# Patient Record
Sex: Female | Born: 1995 | Race: White | Hispanic: No | Marital: Single | Smoking: Current every day smoker
Health system: Southern US, Community
[De-identification: ages and names within clinical notes are randomized; demographics above are authoritative.]

## PROBLEM LIST (undated history)

## (undated) DIAGNOSIS — F32A Depression, unspecified: Secondary | ICD-10-CM

## (undated) DIAGNOSIS — R002 Palpitations: Secondary | ICD-10-CM

## (undated) DIAGNOSIS — E611 Iron deficiency: Secondary | ICD-10-CM

## (undated) HISTORY — DX: Depression, unspecified: F32.A

## (undated) HISTORY — DX: Palpitations: R00.2

## (undated) HISTORY — DX: Iron deficiency: E61.1

---
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
## 2014-03-24 ENCOUNTER — Encounter: Payer: Self-pay | Admitting: Emergency Medicine

## 2014-03-24 ENCOUNTER — Other Ambulatory Visit (HOSPITAL_COMMUNITY)
Admission: RE | Admit: 2014-03-24 | Discharge: 2014-03-24 | Disposition: A | Discharge status: Home / Self Care | Payer: BC Managed Care – PPO | Source: Ambulatory Visit | Attending: Internal Medicine | Admitting: Internal Medicine

## 2014-03-24 ENCOUNTER — Ambulatory Visit (INDEPENDENT_AMBULATORY_CARE_PROVIDER_SITE_OTHER): Payer: BC Managed Care – PPO | Admitting: Emergency Medicine

## 2014-03-24 VITALS — BP 108/62 | HR 94 | Temp 98.6°F | Resp 18 | Ht 67.0 in | Wt 139.0 lb

## 2014-03-24 DIAGNOSIS — Z01419 Encounter for gynecological examination (general) (routine) without abnormal findings: Secondary | ICD-10-CM | POA: Insufficient documentation

## 2014-03-24 DIAGNOSIS — N912 Amenorrhea, unspecified: Secondary | ICD-10-CM

## 2014-03-24 DIAGNOSIS — R87619 Unspecified abnormal cytological findings in specimens from cervix uteri: Secondary | ICD-10-CM

## 2014-03-24 LAB — HCG, SERUM, QUALITATIVE: PREG SERUM: NEGATIVE

## 2014-03-24 NOTE — Progress Notes (Signed)
   Subjective:    Patient ID: Melanie Ruiz, female    DOB: 01-04-1996, 18 y.o.   MRN: [id number]  HPI Comments: 18 yo WF on Depo needs repeat pap with questionable abnormal at last PAP. Last intercourse 3 days ago. LMP irreg with DEPO she is spotting. Last depo 2/27 she is overdue for shot. She has not d/c tobacco AD.   Anemia Signs of blood loss that are present include vaginal bleeding.     Medication List       This list is accurate as of: 03/24/14  1:33 PM.  Always use your most recent med list.               aspirin 81 MG chewable tablet  Chew by mouth daily.     medroxyPROGESTERone 150 MG/ML injection  Commonly known as:  DEPO-PROVERA  Inject 150 mg into the muscle every 3 (three) months.     verapamil 120 MG 24 hr capsule  Commonly known as:  VERELAN PM  Take 120 mg by mouth at bedtime.       Allergies  Allergen Reactions  . Motrin Ib [Ibuprofen] Hives   Past Medical History  Diagnosis Date  . Iron deficiency   . Palpitations       Review of Systems  Genitourinary: Positive for vaginal bleeding.  All other systems reviewed and are negative.  BP 108/62  Pulse 94  Temp(Src) 98.6 F (37 C) (Temporal)  Resp 18  Ht 5\' 7"  (1.702 m)  Wt 139 lb (63.05 kg)  BMI 21.77 kg/m2     Objective:   Physical Exam  Nursing note and vitals reviewed. Constitutional: She is oriented to person, place, and time. She appears well-developed and well-nourished.  HENT:  Head: Normocephalic and atraumatic.  Eyes: Conjunctivae are normal.  Neck: Normal range of motion.  Cardiovascular: Normal rate, regular rhythm, normal heart sounds and intact distal pulses.   Pulmonary/Chest: Effort normal and breath sounds normal.  Abdominal: Soft. Bowel sounds are normal. She exhibits no distension and no mass. There is no tenderness. There is no rebound and no guarding.  Genitourinary: Vagina normal and uterus normal. No vaginal discharge found.  Breasts Fibrocystic    Musculoskeletal: Normal range of motion.  Neurological: She is alert and oriented to person, place, and time.  Skin: Skin is warm and dry.  Psychiatric: She has a normal mood and affect. Judgment normal.          Assessment & Plan:  1. Abnormal Pap- repeat exam  2. Amenorrhea/ spotting vs depo- check preg status, restart depo shots if lab neg  3. Tobacco Dep- advised cessation techniques and need for d/c to decrease Risk

## 2014-03-25 ENCOUNTER — Ambulatory Visit (INDEPENDENT_AMBULATORY_CARE_PROVIDER_SITE_OTHER): Payer: BC Managed Care – PPO | Admitting: *Deleted

## 2014-03-25 DIAGNOSIS — Z3049 Encounter for surveillance of other contraceptives: Secondary | ICD-10-CM

## 2014-03-25 DIAGNOSIS — R87619 Unspecified abnormal cytological findings in specimens from cervix uteri: Secondary | ICD-10-CM

## 2014-03-25 DIAGNOSIS — N912 Amenorrhea, unspecified: Secondary | ICD-10-CM

## 2014-03-25 MED ORDER — MEDROXYPROGESTERONE ACETATE 150 MG/ML IM SUSP
150.0000 mg | Freq: Once | INTRAMUSCULAR | Status: AC
  Administered 2014-03-25: 150 mg via INTRAMUSCULAR

## 2014-03-25 NOTE — Progress Notes (Signed)
Patient ID: Teneya Patty, female   DOB: [birth year], 18 y.o.   MRN: [id number] Patient presents for DepoProvera injection.  Patient was seen in office yesterday 03/24/14, however, was unable  To receive Depo shot due to our scheduling error.  Patient was called to schedule a nurse visit for a  Depo shot on proper scheduling time frame of 11-12 weeks and was told by the front staff answering the phone that she was scheduled for a follow up ov with Loree Fee, PA-C and to "just wait and follow up at that appointment for injection."  Patient tried to tell them she would be past due for shot, but was told to simply wait.  Per orders/protocol, patient had to have blood work for a pregnancy test before being able to receive her Depo shot.  Patient had labs drawn yesterday, 03/24/14 at ov and had a negative Pregnancy serum result.  Per Loree Fee, PA-C , patient was able to resume her Depo shot today and will be placed back on proper scheduling to receive her next injection.  Patient was not charged a nurse visit [phone]) fee nor a co-pay due to clinic's error.

## 2014-03-28 LAB — CYTOLOGY - PAP

## 2014-06-10 ENCOUNTER — Ambulatory Visit: Payer: Self-pay

## 2014-06-12 ENCOUNTER — Ambulatory Visit (INDEPENDENT_AMBULATORY_CARE_PROVIDER_SITE_OTHER): Payer: BC Managed Care – PPO | Admitting: Physician Assistant

## 2014-06-12 ENCOUNTER — Encounter: Payer: Self-pay | Admitting: Physician Assistant

## 2014-06-12 ENCOUNTER — Other Ambulatory Visit: Payer: Self-pay | Admitting: Physician Assistant

## 2014-06-12 VITALS — BP 128/70 | HR 100 | Temp 98.1°F | Resp 16 | Ht 66.0 in | Wt 151.0 lb

## 2014-06-12 DIAGNOSIS — R5381 Other malaise: Secondary | ICD-10-CM

## 2014-06-12 DIAGNOSIS — R5383 Other fatigue: Secondary | ICD-10-CM

## 2014-06-12 DIAGNOSIS — F329 Major depressive disorder, single episode, unspecified: Secondary | ICD-10-CM

## 2014-06-12 DIAGNOSIS — F341 Dysthymic disorder: Secondary | ICD-10-CM

## 2014-06-12 DIAGNOSIS — Z3049 Encounter for surveillance of other contraceptives: Secondary | ICD-10-CM

## 2014-06-12 DIAGNOSIS — Z79899 Other long term (current) drug therapy: Secondary | ICD-10-CM

## 2014-06-12 DIAGNOSIS — F32A Depression, unspecified: Secondary | ICD-10-CM

## 2014-06-12 DIAGNOSIS — F419 Anxiety disorder, unspecified: Secondary | ICD-10-CM

## 2014-06-12 LAB — BASIC METABOLIC PANEL WITH GFR
BUN: 10 mg/dL (ref 6–23)
CO2: 24 meq/L (ref 19–32)
Calcium: 9.8 mg/dL (ref 8.4–10.5)
Chloride: 105 mEq/L (ref 96–112)
Creat: 0.69 mg/dL (ref 0.50–1.10)
GFR, Est Non African American: >89 mL/min
GLUCOSE: 91 mg/dL (ref 70–99)
POTASSIUM: 3.9 meq/L (ref 3.5–5.3)
SODIUM: 139 meq/L (ref 135–145)

## 2014-06-12 LAB — HEPATIC FUNCTION PANEL
ALT: 156 U/L — ABNORMAL HIGH (ref 0–35)
AST: 47 U/L — ABNORMAL HIGH (ref 0–37)
Albumin: 5.1 g/dL (ref 3.5–5.2)
Alkaline Phosphatase: 86 U/L (ref 39–117)
Bilirubin, Direct: 0.2 mg/dL (ref 0.0–0.3)
Indirect Bilirubin: 0.4 mg/dL (ref 0.2–1.1)
Total Bilirubin: 0.6 mg/dL (ref 0.2–1.1)
Total Protein: 7.6 g/dL (ref 6.0–8.3)

## 2014-06-12 LAB — MAGNESIUM: MAGNESIUM: 2 mg/dL (ref 1.5–2.5)

## 2014-06-12 LAB — CBC WITH DIFFERENTIAL/PLATELET
Basophils Absolute: 0.1 10*3/uL (ref 0.0–0.1)
Basophils Relative: 1 % (ref 0–1)
Eosinophils Absolute: 0.2 10*3/uL (ref 0.0–0.7)
Eosinophils Relative: 2 % (ref 0–5)
HCT: 45.3 % (ref 36.0–46.0)
Hemoglobin: 15.6 g/dL — ABNORMAL HIGH (ref 12.0–15.0)
Lymphocytes Relative: 26 % (ref 12–46)
Lymphs Abs: 2.2 10*3/uL (ref 0.7–4.0)
MCH: 31.4 pg (ref 26.0–34.0)
MCHC: 34.4 g/dL (ref 30.0–36.0)
MCV: 91.1 fL (ref 78.0–100.0)
Monocytes Absolute: 0.6 10*3/uL (ref 0.1–1.0)
Monocytes Relative: 7 % (ref 3–12)
Neutro Abs: 5.4 10*3/uL (ref 1.7–7.7)
Neutrophils Relative %: 64 % (ref 43–77)
Platelets: 271 10*3/uL (ref 150–400)
RBC: 4.97 MIL/uL (ref 3.87–5.11)
RDW: 12.9 % (ref 11.5–15.5)
WBC: 8.4 10*3/uL (ref 4.0–10.5)

## 2014-06-12 LAB — VITAMIN B12: Vitamin B-12: 391 pg/mL (ref 211–911)

## 2014-06-12 LAB — TSH: TSH: 0.87 u[IU]/mL (ref 0.350–4.500)

## 2014-06-12 MED ORDER — CITALOPRAM HYDROBROMIDE 20 MG PO TABS: 20.0000 mg | ORAL_TABLET | Freq: Every day | ORAL | Status: DC

## 2014-06-12 MED ORDER — BACLOFEN 10 MG PO TABS: 10.0000 mg | ORAL_TABLET | Freq: Two times a day (BID) | ORAL | Status: DC

## 2014-06-12 MED ORDER — MEDROXYPROGESTERONE ACETATE 150 MG/ML IM SUSP
150.0000 mg | Freq: Once | INTRAMUSCULAR | Status: AC
  Administered 2014-06-12: 150 mg via INTRAMUSCULAR

## 2014-06-12 NOTE — Patient Instructions (Addendum)
Take celexa 1/2 pill for 1-2 weeks and then can increase to one pill if you would like  What is the TMJ? The temporomandibular (tem-PUH-ro-man-DIB-yoo-ler) joint, or the TMJ, connects the upper and lower jawbones. This joint allows the jaw to open wide and move back and forth when you chew, talk, or yawn.There are also several muscles that help this joint move. There can be muscle tightness and pain in the muscle that can cause several symptoms.  What causes TMJ pain? There are many causes of TMJ pain. Repeated chewing (for example, chewing gum) and clenching your teeth can cause pain in the joint. Some TMJ pain has no obvious cause. What can I do to ease the pain? There are many things you can do to help your pain get better. When you have pain:  Eat soft foods and stay away from chewy foods (for example, taffy) Try to use both sides of your mouth to chew Don't chew gum Don't open your mouth wide (for example, during yawning or singing) Don't bite your cheeks or fingernails Lower your amount of stress and worry Applying a warm, damp washcloth to the joint may help. Over-the-counter pain medicines such as ibuprofen (one brand: Advil) or acetaminophen (one brand: Tylenol) might also help. Do not use these medicines if you are allergic to them or if your doctor told you not to use them. How can I stop the pain from coming back? When your pain is better, you can do these exercises to make your muscles stronger and to keep the pain from coming back:  Resisted mouth opening: Place your thumb or two fingers under your chin and open your mouth slowly, pushing up lightly on your chin with your thumb. Hold for three to six seconds. Close your mouth slowly. Resisted mouth closing: Place your thumbs under your chin and your two index fingers on the ridge between your mouth and the bottom of your chin. Push down lightly on your chin as you close your mouth. Tongue up: Slowly open and close your mouth while  keeping the tongue touching the roof of the mouth. Side-to-side jaw movement: Place an object about one fourth of an inch thick (for example, two tongue depressors) between your front teeth. Slowly move your jaw from side to side. Increase the thickness of the object as the exercise becomes easier Forward jaw movement: Place an object about one fourth of an inch thick between your front teeth and move the bottom jaw forward so that the bottom teeth are in front of the top teeth. Increase the thickness of the object as the exercise becomes easier. These exercises should not be painful. If it hurts to do these exercises, stop doing them and talk to your family doctor.

## 2014-06-12 NOTE — Progress Notes (Signed)
   Subjective:    Patient ID: Melanie Ruiz, female    DOB: 1996/06/24, 18 y.o.   MRN: [id number]  HPI 18 y.o. 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 She states for the last month she has been having labile mood, she has been crying randomly or at little things, she states she is trying to start fights. She has not been sleeping well, she can fall asleep well but she has been waking up due to dry mouth. Has lost job recenlty but states does not feel stressed. She has had decreased appetite. Continue to smoke. Has chronic headaches behind her eyes almost daily.    Review of Systems  Constitutional: Positive for activity change, appetite change and fatigue. Negative for fever, chills, diaphoresis and unexpected weight change.  Respiratory: Negative.   Cardiovascular: Negative.   Gastrointestinal: Positive for constipation. Negative for nausea, vomiting, abdominal pain, diarrhea, blood in stool, abdominal distention, anal bleeding and rectal pain.  Genitourinary: Negative.   Musculoskeletal: Negative.   Skin: Negative.   Neurological: Positive for headaches. Negative for dizziness, seizures, facial asymmetry, speech difficulty, light-headedness and numbness.       Objective:   Physical Exam  Constitutional: She is oriented to person, place, and time. She appears well-developed and well-nourished.  HENT:  Head: Normocephalic and atraumatic.  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 Pupils are equal, round, and reactive to light.  Neck: Normal range of motion. Neck supple.  Cardiovascular: Normal rate and regular rhythm.   Pulmonary/Chest: Effort normal.  Abdominal: Soft. Bowel sounds are normal.  Lymphadenopathy:    She has no cervical adenopathy.  Neurological: She is alert and oriented to person, place, and time.       Assessment & Plan:  Depression/Anxiety- check labs, will start celexa  start 1/2 daily stress management techniques  discussed, increase water, good sleep hygiene discussed, increase exercise, and increase veggies. Follow up in 1 month.  BCP- continue depo TMJ- information given to the patient, no gum/decrease hard foods, warm wet wash clothes, decrease stress, talk with dentist about possible night guard, can do massage, and exercise.

## 2014-06-13 LAB — HEPATITIS PANEL, ACUTE
HCV AB: NEGATIVE
HEP B C IGM: NONREACTIVE
HEP B S AG: NEGATIVE
Hep A IgM: NONREACTIVE

## 2014-06-14 ENCOUNTER — Encounter: Payer: Self-pay | Admitting: Physician Assistant

## 2014-06-14 LAB — EPSTEIN-BARR VIRUS VCA ANTIBODY PANEL
EBV EA IgG: <5 U/mL (ref <9.0)
EBV NA IgG: 13.9 U/mL (ref <18.0)
EBV VCA IgG: 33.6 U/mL — ABNORMAL HIGH (ref <18.0)
EBV VCA IgM: <10 U/mL (ref <36.0)

## 2014-07-07 ENCOUNTER — Ambulatory Visit (INDEPENDENT_AMBULATORY_CARE_PROVIDER_SITE_OTHER): Payer: BC Managed Care – PPO | Admitting: Physician Assistant

## 2014-07-07 ENCOUNTER — Encounter: Payer: Self-pay | Admitting: Physician Assistant

## 2014-07-07 VITALS — BP 120/68 | HR 82 | Temp 98.1°F | Resp 16 | Ht 66.0 in | Wt 153.0 lb

## 2014-07-07 DIAGNOSIS — R5383 Other fatigue: Secondary | ICD-10-CM

## 2014-07-07 DIAGNOSIS — R7401 Elevation of levels of liver transaminase levels: Secondary | ICD-10-CM

## 2014-07-07 DIAGNOSIS — N912 Amenorrhea, unspecified: Secondary | ICD-10-CM

## 2014-07-07 DIAGNOSIS — F419 Anxiety disorder, unspecified: Secondary | ICD-10-CM

## 2014-07-07 DIAGNOSIS — R5381 Other malaise: Secondary | ICD-10-CM

## 2014-07-07 DIAGNOSIS — F329 Major depressive disorder, single episode, unspecified: Secondary | ICD-10-CM

## 2014-07-07 DIAGNOSIS — R740 Nonspecific elevation of levels of transaminase and lactic acid dehydrogenase [LDH]: Secondary | ICD-10-CM

## 2014-07-07 DIAGNOSIS — F341 Dysthymic disorder: Secondary | ICD-10-CM

## 2014-07-07 DIAGNOSIS — R7402 Elevation of levels of lactic acid dehydrogenase (LDH): Secondary | ICD-10-CM

## 2014-07-07 NOTE — Progress Notes (Signed)
HPI 18 y.o.female presents for 1 month follow up. Her last visit she was put on celexa for anxiety/mood and her LFTs were very elevated, she had a + mono test for early infection. She states she is feeling much better, she is still stressed from work but she is not crying as much and not have mood swings like before. She is still very tired, she denies AB pain, nausea, yellow skin. She is on Depo and still has not had a menstrual period and would like a pregnancy test today.   Past Medical History  Diagnosis Date  . Iron deficiency   . Palpitations      Allergies  Allergen Reactions  . Motrin Ib [Ibuprofen] Hives      Current Outpatient Prescriptions on File Prior to Visit  Medication Sig Dispense Refill  . aspirin 81 MG chewable tablet Chew by mouth daily.      . baclofen (LIORESAL) 10 MG tablet Take 1 tablet (10 mg total) by mouth 2 (two) times daily.  30 tablet  0  . citalopram (CELEXA) 20 MG tablet Take 1 tablet (20 mg total) by mouth daily.  30 tablet  2  . medroxyPROGESTERone (DEPO-PROVERA) 150 MG/ML injection Inject 150 mg into the muscle every 3 (three) months.       No current facility-administered medications on file prior to visit.    ROS: all negative expect above.   Physical: Filed Weights   07/07/14 1551  Weight: 153 lb (69.4 kg)   BP 120/68  Pulse 82  Temp(Src) 98.1 F (36.7 C)  Resp 16  Ht  (1.676 m)  Wt 153 lb (69.4 kg)  BMI 24.71 kg/m2 General Appearance: Well nourished, in no apparent distress. Eyes: PERRLA, EOMs. Sinuses: No Frontal/maxillary tenderness ENT/Mouth: Ext aud canals clear, normal light reflex with TMs without erythema, bulging. Post pharynx without erythema, swelling, exudate.  Respiratory: CTAB Cardio: RRR, no murmurs, rubs or gallops. Peripheral pulses brisk and equal bilaterally, without edema. No aortic or femoral bruits. Abdomen: Soft, with bowl sounds. Tender left flank with possible spleenomegaly, no guarding,  rebound. Lymphatics: Non tender without lymphadenopathy.  Musculoskeletal: Full ROM all peripheral extremities, 5/5 strength, and normal gait. Skin: Warm, dry without rashes, lesions, ecchymosis.  Neuro: Cranial nerves intact, reflexes equal bilaterally. Normal muscle tone, no cerebellar symptoms. Sensation intact.  Pysch: Awake and oriented X 3, normal affect, Insight and Judgment appropriate.   Assessment and Plan: LFTs elevation- likely mono- recheck labs including Serum ceruloplasmin and Antismooth/antimitochondrial- rest, water, and no contact sports.  Anxiety/depression- continue celexa Amenorrhea- check serum preg

## 2014-07-08 LAB — CBC WITH DIFFERENTIAL/PLATELET
Basophils Absolute: 0.1 10*3/uL (ref 0.0–0.1)
Basophils Relative: 1 % (ref 0–1)
EOS ABS: 0.1 10*3/uL (ref 0.0–0.7)
EOS PCT: 2 % (ref 0–5)
HEMATOCRIT: 42.5 % (ref 36.0–46.0)
Hemoglobin: 14.5 g/dL (ref 12.0–15.0)
LYMPHS ABS: 2.2 10*3/uL (ref 0.7–4.0)
LYMPHS PCT: 30 % (ref 12–46)
MCH: 31.5 pg (ref 26.0–34.0)
MCHC: 34.1 g/dL (ref 30.0–36.0)
MCV: 92.2 fL (ref 78.0–100.0)
MONO ABS: 0.6 10*3/uL (ref 0.1–1.0)
MONOS PCT: 9 % (ref 3–12)
Neutro Abs: 4.2 10*3/uL (ref 1.7–7.7)
Neutrophils Relative %: 58 % (ref 43–77)
PLATELETS: 267 10*3/uL (ref 150–400)
RBC: 4.61 MIL/uL (ref 3.87–5.11)
RDW: 13.2 % (ref 11.5–15.5)
WBC: 7.2 10*3/uL (ref 4.0–10.5)

## 2014-07-08 LAB — HCG, SERUM, QUALITATIVE: Preg, Serum: NEGATIVE

## 2014-07-08 LAB — HEPATIC FUNCTION PANEL
ALBUMIN: 4.6 g/dL (ref 3.5–5.2)
ALK PHOS: 83 U/L (ref 39–117)
ALT: 49 U/L — AB (ref 0–35)
AST: 28 U/L (ref 0–37)
BILIRUBIN INDIRECT: 0.6 mg/dL (ref 0.2–1.1)
Bilirubin, Direct: 0.2 mg/dL (ref 0.0–0.3)
TOTAL PROTEIN: 6.8 g/dL (ref 6.0–8.3)
Total Bilirubin: 0.8 mg/dL (ref 0.2–1.1)

## 2014-07-08 LAB — EPSTEIN-BARR VIRUS VCA ANTIBODY PANEL
EBV EA IgG: <5 U/mL (ref <9.0)
EBV NA IGG: 13.9 U/mL (ref <18.0)
EBV VCA IGG: 22.4 U/mL — AB (ref <18.0)

## 2014-07-10 LAB — MITOCHONDRIAL/SMOOTH MUSCLE AB PNL
Mitochondrial M2 Ab, IgG: 0.28 (ref <0.91)
Smooth Muscle Ab: 7 U (ref <20)

## 2014-07-14 LAB — CERULOPLASMIN: Ceruloplasmin: 25 mg/dL (ref 18–53)

## 2014-07-28 MED ORDER — BACLOFEN 10 MG PO TABS: 10.0000 mg | ORAL_TABLET | Freq: Two times a day (BID) | ORAL | Status: DC

## 2014-08-28 ENCOUNTER — Ambulatory Visit: Payer: Self-pay

## 2014-09-02 ENCOUNTER — Ambulatory Visit (INDEPENDENT_AMBULATORY_CARE_PROVIDER_SITE_OTHER): Payer: BC Managed Care – PPO | Admitting: Physician Assistant

## 2014-09-02 ENCOUNTER — Encounter: Payer: Self-pay | Admitting: Physician Assistant

## 2014-09-02 VITALS — BP 118/60 | HR 102 | Temp 98.6°F | Resp 16 | Ht 66.0 in | Wt 158.0 lb

## 2014-09-02 DIAGNOSIS — F419 Anxiety disorder, unspecified: Secondary | ICD-10-CM

## 2014-09-02 DIAGNOSIS — F418 Other specified anxiety disorders: Secondary | ICD-10-CM

## 2014-09-02 DIAGNOSIS — Z3042 Encounter for surveillance of injectable contraceptive: Secondary | ICD-10-CM

## 2014-09-02 DIAGNOSIS — G43809 Other migraine, not intractable, without status migrainosus: Secondary | ICD-10-CM

## 2014-09-02 DIAGNOSIS — F329 Major depressive disorder, single episode, unspecified: Secondary | ICD-10-CM

## 2014-09-02 DIAGNOSIS — Z720 Tobacco use: Secondary | ICD-10-CM

## 2014-09-02 DIAGNOSIS — F17200 Nicotine dependence, unspecified, uncomplicated: Secondary | ICD-10-CM

## 2014-09-02 MED ORDER — BUTALBITAL-APAP-CAFFEINE 50-300-40 MG PO CAPS
1.0000 | ORAL_CAPSULE | ORAL | Status: AC | PRN
Start: 2014-09-02 — End: 2015-09-03

## 2014-09-02 MED ORDER — MEDROXYPROGESTERONE ACETATE 150 MG/ML IM SUSP
150.0000 mg | Freq: Once | INTRAMUSCULAR | Status: AC
  Administered 2014-09-02: 150 mg via INTRAMUSCULAR

## 2014-09-02 NOTE — Progress Notes (Signed)
Subjective:    Patient ID: Junius RoadsSara Belvedere, female    DOB: 10-19-1995, 18 y.o.   MRN: [id number]  HPI A 18yo Caucasian female presents for follow up for Depo-Provera and states she is having increase in headaches and in weight.  Patient states she has been on Depo-Provera since February or March 2015.  She states the headaches have been going on for years, but have gotten worse in past 2 weeks.  She states they are mostly located behind her eyes and temporal region bilaterally.  She reports that she wakes up with them sometimes and will last all day.  They also start mostly during lunch time.  She describes the pain as sharp and the pain is 8 out of 10 when they occur.  She denies pain radiation.  She has tried NSAIDs OTC, with mild relief.  She states she does have photophobia when they occur and denies hearing sensitivity.  Patient does report anxiety, stress and depression and is currently taking Celexa 20mg  daily and states she does not notice a difference.    She currently smokes- 10 cigarettes per day for the past 3-4 years. She does report claudication symptoms.   Does not use protection during intercourse.  Has been in relationship for 2 years. Pap- June 2015- Negative. Pregnancy-  LMP- Before Depo shot (spotting once in while)    Birth Control- Depo Shot   Regular/Irregular- Regular menses before Depo shot due to birth control pill (low estrogen pill)   [id number]  Risk Factors for DVT/PE: calf pain and On Depo-Provera and currently smokes   EKG- 02/19/13- PACs with no ST changes  Review of Systems  Constitutional: Positive for diaphoresis. Negative for fever, chills, appetite change and fatigue.  HENT: Negative for ear discharge, ear pain, postnasal drip, rhinorrhea, sinus pressure, sore throat, tinnitus and trouble swallowing.        Nasal congestion  Eyes: Negative.   Respiratory: Positive for chest tightness. Negative for cough and wheezing.        Chest tightness once in awhile.   Cardiovascular: Positive for palpitations. Negative for chest pain.  Gastrointestinal: Positive for constipation. Negative for nausea, vomiting, abdominal pain and diarrhea.       Metamucil in mornings.  Bowel movement once a week.    Genitourinary: Negative.   Musculoskeletal: Negative.        Claudication symptoms when walking quickly.  Skin: Negative.   Neurological: Positive for headaches. Negative for dizziness, speech difficulty, weakness and light-headedness.       Vivid dreams every night.  Psychiatric/Behavioral: The patient is nervous/anxious.        Anxiety, stress and depression.     Past Medical History  Diagnosis Date  . Iron deficiency   . Palpitations    Current Outpatient Prescriptions on File Prior to Visit  Medication Sig Dispense Refill  . aspirin 81 MG chewable tablet Chew by mouth daily.    . citalopram (CELEXA) 20 MG tablet Take 1 tablet (20 mg total) by mouth daily. 30 tablet 2  . medroxyPROGESTERone (DEPO-PROVERA) 150 MG/ML injection Inject 150 mg into the muscle every 3 (three) months.     No current facility-administered medications on file prior to visit.   Allergies  Allergen Reactions  . Motrin Ib [Ibuprofen] Hives     BP 118/60 mmHg  Pulse 102  Temp(Src) 98.6 F (37 C) (Temporal)  Resp 16  Ht 5\' 6"  (1.676 m)  Wt 158 lb (71.668 kg)  BMI 25.51 kg/m2  SpO2  99% Wt Readings from Last 3 Encounters:  09/02/14 158 lb (71.668 kg) (86 %*, Z = 1.10)  07/07/14 153 lb (69.4 kg) (83 %*, Z = 0.97)  06/12/14 151 lb (68.493 kg) (82 %*, Z = 0.92)   * Growth percentiles are based on CDC 2-20 Years data.   Objective:   Physical Exam  Constitutional: She is oriented to person, place, and time. She appears well-developed and well-nourished. No distress.  HENT:  Head: Normocephalic.  Right Ear: Tympanic membrane, external ear and ear canal normal.  Left Ear: Tympanic membrane, external ear and ear canal normal.  Nose: Nose normal. Right sinus exhibits no  maxillary sinus tenderness and no frontal sinus tenderness. Left sinus exhibits no maxillary sinus tenderness and no frontal sinus tenderness.  Mouth/Throat: Uvula is midline, oropharynx is clear and moist and mucous membranes are normal. Mucous membranes are not pale and not dry. No trismus in the jaw. No uvula swelling. No oropharyngeal exudate, posterior oropharyngeal edema, posterior oropharyngeal erythema or tonsillar abscesses.  Eyes: Conjunctivae, EOM and lids are normal. Pupils are equal, round, and reactive to light. Right eye exhibits no discharge. Left eye exhibits no discharge. No scleral icterus.  Neck: Trachea normal, normal range of motion and phonation normal. Neck supple. No tracheal tenderness present. No tracheal deviation present. No thyromegaly present.  Cardiovascular: Normal rate, regular rhythm, S1 normal, S2 normal, normal heart sounds, intact distal pulses and normal pulses.  Exam reveals no gallop, no distant heart sounds and no friction rub.   No murmur heard. Pulmonary/Chest: Effort normal and breath sounds normal. No stridor. No respiratory distress. She has no decreased breath sounds. She has no wheezes. She has no rhonchi. She has no rales. She exhibits no tenderness.  Abdominal: Soft. Bowel sounds are normal. She exhibits no distension and no mass. There is no tenderness. There is no rebound and no guarding.  Musculoskeletal: Normal range of motion.  Lymphadenopathy:  No tenderness or LAD.  Neurological: She is alert and oriented to person, place, and time. She has normal strength. No cranial nerve deficit or sensory deficit.  Skin: Skin is warm, dry and intact. No rash noted. She is not diaphoretic.  Psychiatric: She has a normal mood and affect. Her speech is normal and behavior is normal. Judgment and thought content normal. Cognition and memory are normal.  Vitals reviewed.     Assessment & Plan:  1. Surveillance for Depo-Provera contraception -Gave  Depo-Provera shot in office today.  Please get another shot in 3 months.- medroxyPROGESTERone (DEPO-PROVERA) injection 150 mg; Inject 1 mL (150 mg total) into the muscle once. -Encouraged pt to use condoms during intercourse to prevent STDs.  2. Other migraine without status migrainosus, not intractable -Take Fioricet as prescribed for migraines.  Do not take any acetaminophen or Tylenol over the counter with this medication. - Butalbital-APAP-Caffeine (FIORICET) 50-300-40 MG CAPS; Take 1 capsule by mouth every 4 (four) hours as needed (for migraines). Max: 6 capsules a day.  Dispense: 180 capsule; Refill: 0  3. Anxiety and depression Continue Celexa as prescribed at 20mg  daily.  Might increase to 40mg  at next visit.   4. Tobacco use disorder Counseled pt on smoking cessation.  Patient was encouraged to not smoke in house and to go outside and smoke to help cut down on cigarettes.  Told pt we can discuss smoking cessation help/medication when pt is ready to quit smoking.    Patient was counseled on risks of smoking and being on Depo-Provera (MI,  Stroke, DVT, PE).  Patient takes Aspirin 81mg  for prevention of risks.   If you are having worst headache of your life, change in speech or vision, weakness in one arm or leg, chest pain, back pain, swelling, redness or pain in one leg, left arm pain or jaw pain, abdominal pain, nausea or vomiting, then go to ER immediately.  Discussed medication effects and SE's.  Pt agreed to treatment plan. Please follow up in one month for medication recheck.  Maquita Sandoval, Lise AuerJennifer L, PA-C 9:27 AM [facility]

## 2014-09-02 NOTE — Patient Instructions (Addendum)
-Take the Fioricet as prescribed for headaches.  Max-6 capsules per day. -Continue Depo shot. -If you are having worst headache of your life, change in speech or vision, weakness in one arm, chest pain, back pain, swelling in one leg with redness, abdominal pain, nausea, vomiting, then go to ER immediately.   Please follow up in one month to see how medication is going.    Migraine Headache A migraine headache is an intense, throbbing pain on one or both sides of your head. A migraine can last for 30 minutes to several hours. CAUSES  The exact cause of a migraine headache is not always known. However, a migraine may be caused when nerves in the brain become irritated and release chemicals that cause inflammation. This causes pain. Certain things may also trigger migraines, such as:  Alcohol.  Smoking.  Stress.  Menstruation.  Aged cheeses.  Foods or drinks that contain nitrates, glutamate, aspartame, or tyramine.  Lack of sleep.  Chocolate.  Caffeine.  Hunger.  Physical exertion.  Fatigue.  Medicines used to treat chest pain (nitroglycerine), birth control pills, estrogen, and some blood pressure medicines. SIGNS AND SYMPTOMS  Pain on one or both sides of your head.  Pulsating or throbbing pain.  Severe pain that prevents daily activities.  Pain that is aggravated by any physical activity.  Nausea, vomiting, or both.  Dizziness.  Pain with exposure to bright lights, loud noises, or activity.  General sensitivity to bright lights, loud noises, or smells. Before you get a migraine, you may get warning signs that a migraine is coming (aura). An aura may include:  Seeing flashing lights.  Seeing bright spots, halos, or zigzag lines.  Having tunnel vision or blurred vision.  Having feelings of numbness or tingling.  Having trouble talking.  Having muscle weakness. DIAGNOSIS  A migraine headache is often diagnosed based on:  Symptoms.  Physical  exam.  A CT scan or MRI of your head. These imaging tests cannot diagnose migraines, but they can help rule out other causes of headaches. TREATMENT Medicines may be given for pain and nausea. Medicines can also be given to help prevent recurrent migraines.  HOME CARE INSTRUCTIONS  Only take over-the-counter or prescription medicines for pain or discomfort as directed by your health care provider. The use of long-term narcotics is not recommended.  Lie down in a dark, quiet room when you have a migraine.  Keep a journal to find out what may trigger your migraine headaches. For example, write down:  What you eat and drink.  How much sleep you get.  Any change to your diet or medicines.  Limit alcohol consumption.  Quit smoking if you smoke.  Get 7-9 hours of sleep, or as recommended by your health care provider.  Limit stress.  Keep lights dim if bright lights bother you and make your migraines worse. SEEK IMMEDIATE MEDICAL CARE IF:   Your migraine becomes severe.  You have a fever.  You have a stiff neck.  You have vision loss.  You have muscular weakness or loss of muscle control.  You start losing your balance or have trouble walking.  You feel faint or pass out.  You have severe symptoms that are different from your first symptoms. MAKE SURE YOU:   Understand these instructions.  Will watch your condition.  Will get help right away if you are not doing well or get worse. Document Released: 10/03/2005 Document Revised: 02/17/2014 Document Reviewed: 06/10/2013 [facility] Patient Information 2015 [employer] [place].  This information is not intended to replace advice given to you by your health care provider. Make sure you discuss any questions you have with your health care provider.

## 2014-10-02 ENCOUNTER — Ambulatory Visit: Payer: Self-pay | Admitting: Physician Assistant

## 2014-10-20 ENCOUNTER — Ambulatory Visit: Payer: Self-pay | Admitting: Physician Assistant

## 2014-12-06 ENCOUNTER — Encounter: Payer: Self-pay | Admitting: *Deleted

## 2015-01-05 ENCOUNTER — Ambulatory Visit: Payer: BLUE CROSS/BLUE SHIELD | Admitting: Physician Assistant

## 2015-01-05 ENCOUNTER — Encounter: Payer: Self-pay | Admitting: Physician Assistant

## 2015-01-05 VITALS — BP 140/60 | HR 110 | Temp 98.4°F | Resp 18 | Ht 66.0 in | Wt 168.0 lb

## 2015-01-05 DIAGNOSIS — Z711 Person with feared health complaint in whom no diagnosis is made: Secondary | ICD-10-CM

## 2015-01-05 DIAGNOSIS — F17200 Nicotine dependence, unspecified, uncomplicated: Secondary | ICD-10-CM

## 2015-01-05 DIAGNOSIS — N926 Irregular menstruation, unspecified: Secondary | ICD-10-CM

## 2015-01-05 DIAGNOSIS — G43809 Other migraine, not intractable, without status migrainosus: Secondary | ICD-10-CM

## 2015-01-05 MED ORDER — NORGESTIM-ETH ESTRAD TRIPHASIC 0.18/0.215/0.25 MG-25 MCG PO TABS: 1.0000 | ORAL_TABLET | Freq: Every day | ORAL | Status: DC

## 2015-01-05 MED ORDER — ATENOLOL 25 MG PO TABS: ORAL_TABLET | ORAL | Status: DC

## 2015-01-05 NOTE — Patient Instructions (Signed)
°Contraception Choices °Contraception (birth control) is the use of any methods or devices to prevent pregnancy. Below are some methods to help avoid pregnancy. °HORMONAL METHODS  °· Contraceptive implant. This is a thin, plastic tube containing progesterone hormone. It does not contain estrogen hormone. Your health care provider inserts the tube in the inner part of the upper arm. The tube can remain in place for up to 3 years. After 3 years, the implant must be removed. The implant prevents the ovaries from releasing an egg (ovulation), thickens the cervical mucus to prevent sperm from entering the uterus, and thins the lining of the inside of the uterus. °· Progesterone-only injections. These injections are given every 3 months by your health care provider to prevent pregnancy. This synthetic progesterone hormone stops the ovaries from releasing eggs. It also thickens cervical mucus and changes the uterine lining. This makes it harder for sperm to survive in the uterus. °· Birth control pills. These pills contain estrogen and progesterone hormone. They work by preventing the ovaries from releasing eggs (ovulation). They also cause the cervical mucus to thicken, preventing the sperm from entering the uterus. Birth control pills are prescribed by a health care provider. Birth control pills can also be used to treat heavy periods. °· Minipill. This type of birth control pill contains only the progesterone hormone. They are taken every day of each month and must be prescribed by your health care provider. °· Birth control patch. The patch contains hormones similar to those in birth control pills. It must be changed once a week and is prescribed by a health care provider. °· Vaginal ring. The ring contains hormones similar to those in birth control pills. It is left in the vagina for 3 weeks, removed for 1 week, and then a new one is put back in place. The patient must be comfortable inserting and removing the ring  from the vagina. A health care provider's prescription is necessary. °· Emergency contraception. Emergency contraceptives prevent pregnancy after unprotected sexual intercourse. This pill can be taken right after sex or up to 5 days after unprotected sex. It is most effective the sooner you take the pills after having sexual intercourse. Most emergency contraceptive pills are available without a prescription. Check with your pharmacist. Do not use emergency contraception as your only form of birth control. °BARRIER METHODS  °· Female condom. This is a thin sheath (latex or rubber) that is worn over the penis during sexual intercourse. It can be used with spermicide to increase effectiveness. °· Female condom. This is a soft, loose-fitting sheath that is put into the vagina before sexual intercourse. °· Diaphragm. This is a soft, latex, dome-shaped barrier that must be fitted by a health care provider. It is inserted into the vagina, along with a spermicidal jelly. It is inserted before intercourse. The diaphragm should be left in the vagina for 6 to 8 hours after intercourse. °· Cervical cap. This is a round, soft, latex or plastic cup that fits over the cervix and must be fitted by a health care provider. The cap can be left in place for up to 48 hours after intercourse. °· Sponge. This is a soft, circular piece of polyurethane foam. The sponge has spermicide in it. It is inserted into the vagina after wetting it and before sexual intercourse. °· Spermicides. These are chemicals that kill or block sperm from entering the cervix and uterus. They come in the form of creams, jellies, suppositories, foam, or tablets. They do not require a   prescription. They are inserted into the vagina with an applicator before having sexual intercourse. The process must be repeated every time you have sexual intercourse. °INTRAUTERINE CONTRACEPTION °· Intrauterine device (IUD). This is a T-shaped device that is put in a woman's uterus  during a menstrual period to prevent pregnancy. There are 2 types: °¨ Copper IUD. This type of IUD is wrapped in copper wire and is placed inside the uterus. Copper makes the uterus and fallopian tubes produce a fluid that kills sperm. It can stay in place for 10 years. °¨ Hormone IUD. This type of IUD contains the hormone progestin (synthetic progesterone). The hormone thickens the cervical mucus and prevents sperm from entering the uterus, and it also thins the uterine lining to prevent implantation of a fertilized egg. The hormone can weaken or kill the sperm that get into the uterus. It can stay in place for 3-5 years, depending on which type of IUD is used. °PERMANENT METHODS OF CONTRACEPTION °· Female tubal ligation. This is when the woman's fallopian tubes are surgically sealed, tied, or blocked to prevent the egg from traveling to the uterus. °· Hysteroscopic sterilization. This involves placing a small coil or insert into each fallopian tube. Your doctor uses a technique called hysteroscopy to do the procedure. The device causes scar tissue to form. This results in permanent blockage of the fallopian tubes, so the sperm cannot fertilize the egg. It takes about 3 months after the procedure for the tubes to become blocked. You must use another form of birth control for these 3 months. °· Female sterilization. This is when the female has the tubes that carry sperm tied off (vasectomy). This blocks sperm from entering the vagina during sexual intercourse. After the procedure, the man can still ejaculate fluid (semen). °NATURAL PLANNING METHODS °· Natural family planning. This is not having sexual intercourse or using a barrier method (condom, diaphragm, cervical cap) on days the woman could become pregnant. °· Calendar method. This is keeping track of the length of each menstrual cycle and identifying when you are fertile. °· Ovulation method. This is avoiding sexual intercourse during ovulation. °· Symptothermal  method. This is avoiding sexual intercourse during ovulation, using a thermometer and ovulation symptoms. °· Post-ovulation method. This is timing sexual intercourse after you have ovulated. °Regardless of which type or method of contraception you choose, it is important that you use condoms to protect against the transmission of sexually transmitted infections (STIs). Talk with your health care provider about which form of contraception is most appropriate for you. °Document Released: 10/03/2005 Document Revised: 10/08/2013 Document Reviewed: 03/28/2013 °[employer]® Patient Information ©2015 [employer]. This information is not intended to replace advice given to you by your health care provider. Make sure you discuss any questions you have with your health care provider. °Migraine Headache °A migraine headache is an intense, throbbing pain on one or both sides of your head. A migraine can last for 30 minutes to several hours. °CAUSES  °The exact cause of a migraine headache is not always known. However, a migraine may be caused when nerves in the brain become irritated and release chemicals that cause inflammation. This causes pain. °Certain things may also trigger migraines, such as: °· Alcohol. °· Smoking. °· Stress. °· Menstruation. °· Aged cheeses. °· Foods or drinks that contain nitrates, glutamate, aspartame, or tyramine. °· Lack of sleep. °· Chocolate. °· Caffeine. °· Hunger. °· Physical exertion. °· Fatigue. °· Medicines used to treat chest pain (nitroglycerine), birth control pills, estrogen, and   some blood pressure medicines. °SIGNS AND SYMPTOMS °· Pain on one or both sides of your head. °· Pulsating or throbbing pain. °· Severe pain that prevents daily activities. °· Pain that is aggravated by any physical activity. °· Nausea, vomiting, or both. °· Dizziness. °· Pain with exposure to bright lights, loud noises, or activity. °· General sensitivity to bright lights, loud noises, or smells. °Before you get  a migraine, you may get warning signs that a migraine is coming (aura). An aura may include: °· Seeing flashing lights. °· Seeing bright spots, halos, or zigzag lines. °· Having tunnel vision or blurred vision. °· Having feelings of numbness or tingling. °· Having trouble talking. °· Having muscle weakness. °DIAGNOSIS  °A migraine headache is often diagnosed based on: °· Symptoms. °· Physical exam. °· A CT scan or MRI of your head. These imaging tests cannot diagnose migraines, but they can help rule out other causes of headaches. °TREATMENT °Medicines may be given for pain and nausea. Medicines can also be given to help prevent recurrent migraines.  °HOME CARE INSTRUCTIONS °· Only take over-the-counter or prescription medicines for pain or discomfort as directed by your health care provider. The use of long-term narcotics is not recommended. °· Lie down in a dark, quiet room when you have a migraine. °· Keep a journal to find out what may trigger your migraine headaches. For example, write down: °· What you eat and drink. °· How much sleep you get. °· Any change to your diet or medicines. °· Limit alcohol consumption. °· Quit smoking if you smoke. °· Get 7-9 hours of sleep, or as recommended by your health care provider. °· Limit stress. °· Keep lights dim if bright lights bother you and make your migraines worse. °SEEK IMMEDIATE MEDICAL CARE IF:  °· Your migraine becomes severe. °· You have a fever. °· You have a stiff neck. °· You have vision loss. °· You have muscular weakness or loss of muscle control. °· You start losing your balance or have trouble walking. °· You feel faint or pass out. °· You have severe symptoms that are different from your first symptoms. °MAKE SURE YOU:  °· Understand these instructions. °· Will watch your condition. °· Will get help right away if you are not doing well or get worse. °Document Released: 10/03/2005 Document Revised: 02/17/2014 Document Reviewed: 06/10/2013 °[employer]®  Patient Information ©2015 [employer]. This information is not intended to replace advice given to you by your health care provider. Make sure you discuss any questions you have with your health care provider. ° °

## 2015-01-05 NOTE — Progress Notes (Signed)
Subjective:    Melanie Ruiz is a 19 y.o. female who presents for contraception counseling. She has been on Depo for since last Feb and has been having weight gain, mood swings, and has not had menses since being on it and would like to get off due to these. The patient is sexually active. Pertinent past medical history: current smoker. She does have a history of migraines without aura, have been happening more frequently, has tried verapmil n the past that did not help. She is concerned that boyfriend cheated on her 2 months ago, no symptoms but would like testing.   Menstrual History: Menarche age: 3/14 No LMP recorded. Patient has had an injection.    The following portions of the patient's history were reviewed and updated as appropriate: allergies, current medications, past family history, past medical history, past social history, past surgical history and problem list.  Review of Systems Pertinent items are noted in HPI.   Objective:    BP 140/60 mmHg  Pulse 110  Temp(Src) 98.4 F (36.9 C) (Temporal)  Resp 18  Ht 5\' 6"  (1.676 m)  Wt 168 lb (76.204 kg)  BMI 27.13 kg/m2  General Appearance:    Alert, cooperative, no distress, appears stated age  Head:    Normocephalic, without obvious abnormality, atraumatic  Eyes:    PERRL, conjunctiva/corneas clear, EOM's intact, fundi    benign, both eyes  Ears:    Normal TM's and external ear canals, both ears  Nose:   Nares normal, septum midline, mucosa normal, no drainage    or sinus tenderness  Throat:   Lips, mucosa, and tongue normal; teeth and gums normal  Neck:   Supple, symmetrical, trachea midline, no adenopathy;    thyroid:  no enlargement/tenderness/nodules; no carotid   bruit or JVD  Back:     Symmetric, no curvature, ROM normal, no CVA tenderness  Lungs:     Clear to auscultation bilaterally, respirations unlabored  Chest Wall:    No tenderness or deformity   Heart:    Regular rate and rhythm, S1 and S2 normal, no murmur,  rub   or gallop  Breast Exam:    No tenderness, masses, or nipple abnormality  Abdomen:     Soft, non-tender, bowel sounds active all four quadrants,    no masses, no organomegaly  Genitalia:    Normal female without lesion, discharge or tenderness  Rectal:    Normal tone, normal prostate, no masses or tenderness;   guaiac negative stool  Extremities:   Extremities normal, atraumatic, no cyanosis or edema  Pulses:   2+ and symmetric all extremities  Skin:   Skin color, texture, turgor normal, no rashes or lesions  Lymph nodes:   Cervical, supraclavicular, and axillary nodes normal  Neurologic:   CNII-XII intact, normal strength, sensation and reflexes    throughout     Assessment and Plan:    19 y.o., discontinuing Depo-Provera injections, due to AE's.   Last shot was Dec, is due today. Will start on low estrogen due to smoking.  Tri cyclen low sent in, start today, use alternative form of protection x 2-4 weeks  All questions answered. Contraception: pill with low estrogen. . Educational material distributed. Follow up as needed.    STD testing Will get testing LONG discussion about safe sex .  Migraines- ? TMJ- will start on atenolol 25mg  1/2 pill at night and can increase to 1 pill.

## 2015-01-06 LAB — GC/CHLAMYDIA PROBE AMP, URINE
CHLAMYDIA, SWAB/URINE, PCR: NEGATIVE
GC Probe Amp, Urine: NEGATIVE

## 2015-01-06 LAB — HIV ANTIBODY (ROUTINE TESTING W REFLEX): HIV 1&2 Ab, 4th Generation: NONREACTIVE

## 2015-01-06 LAB — HSV(HERPES SIMPLEX VRS) I + II AB-IGG
HSV 1 Glycoprotein G Ab, IgG: <0.1 IV
HSV 2 Glycoprotein G Ab, IgG: <0.1 IV

## 2015-01-06 LAB — RPR

## 2015-02-28 ENCOUNTER — Emergency Department (HOSPITAL_BASED_OUTPATIENT_CLINIC_OR_DEPARTMENT_OTHER)
Admission: EM | Admit: 2015-02-28 | Discharge: 2015-02-28 | Disposition: A | Discharge status: Home / Self Care | Payer: BLUE CROSS/BLUE SHIELD | Attending: Emergency Medicine | Admitting: Emergency Medicine

## 2015-02-28 ENCOUNTER — Encounter (HOSPITAL_BASED_OUTPATIENT_CLINIC_OR_DEPARTMENT_OTHER): Payer: Self-pay | Admitting: Emergency Medicine

## 2015-02-28 DIAGNOSIS — L509 Urticaria, unspecified: Secondary | ICD-10-CM | POA: Diagnosis not present

## 2015-02-28 DIAGNOSIS — Z7982 Long term (current) use of aspirin: Secondary | ICD-10-CM | POA: Diagnosis not present

## 2015-02-28 DIAGNOSIS — Z79899 Other long term (current) drug therapy: Secondary | ICD-10-CM | POA: Insufficient documentation

## 2015-02-28 DIAGNOSIS — R21 Rash and other nonspecific skin eruption: Secondary | ICD-10-CM | POA: Diagnosis present

## 2015-02-28 DIAGNOSIS — Z720 Tobacco use: Secondary | ICD-10-CM | POA: Insufficient documentation

## 2015-02-28 DIAGNOSIS — Z8639 Personal history of other endocrine, nutritional and metabolic disease: Secondary | ICD-10-CM | POA: Insufficient documentation

## 2015-02-28 MED ORDER — PREDNISONE 20 MG PO TABS: 40.0000 mg | ORAL_TABLET | Freq: Every day | ORAL | Status: DC

## 2015-02-28 MED ORDER — FAMOTIDINE IN NACL 20-0.9 MG/50ML-% IV SOLN
20.0000 mg | Freq: Once | INTRAVENOUS | Status: AC
  Administered 2015-02-28: 20 mg via INTRAVENOUS
  Filled 2015-02-28: qty 50

## 2015-02-28 MED ORDER — DIPHENHYDRAMINE HCL 50 MG/ML IJ SOLN
50.0000 mg | Freq: Once | INTRAMUSCULAR | Status: AC
  Administered 2015-02-28: 50 mg via INTRAVENOUS
  Filled 2015-02-28: qty 1

## 2015-02-28 MED ORDER — METHYLPREDNISOLONE SODIUM SUCC 125 MG IJ SOLR
125.0000 mg | Freq: Once | INTRAMUSCULAR | Status: AC
  Administered 2015-02-28: 125 mg via INTRAVENOUS
  Filled 2015-02-28: qty 2

## 2015-02-28 NOTE — Discharge Instructions (Signed)
You were evaluated in the ED for your rash. Her rash appeared to be improving after medications in the ED. Please take your steroid prescription to help with her symptoms. Do not take this in conjunction with other steroids you have been given. Please follow-up with primary care for further evaluation and management of symptoms. Return to ED for worsening symptoms.  Allergies Allergies may happen from anything your body is sensitive to. This may be food, medicines, pollens, chemicals, and nearly anything around you in everyday life that produces allergens. An allergen is anything that causes an allergy producing substance. Heredity is often a factor in causing these problems. This means you may have some of the same allergies as your parents. Food allergies happen in all age groups. Food allergies are some of the most severe and life threatening. Some common food allergies are cow's milk, seafood, eggs, nuts, wheat, and soybeans. SYMPTOMS   Swelling around the mouth.  An itchy red rash or hives.  Vomiting or diarrhea.  Difficulty breathing. SEVERE ALLERGIC REACTIONS ARE LIFE-THREATENING. This reaction is called anaphylaxis. It can cause the mouth and throat to swell and cause difficulty with breathing and swallowing. In severe reactions only a trace amount of food (for example, peanut oil in a salad) may cause death within seconds. Seasonal allergies occur in all age groups. These are seasonal because they usually occur during the same season every year. They may be a reaction to molds, grass pollens, or tree pollens. Other causes of problems are house dust mite allergens, pet dander, and mold spores. The symptoms often consist of nasal congestion, a runny itchy nose associated with sneezing, and tearing itchy eyes. There is often an associated itching of the mouth and ears. The problems happen when you come in contact with pollens and other allergens. Allergens are the particles in the air that the  body reacts to with an allergic reaction. This causes you to release allergic antibodies. Through a chain of events, these eventually cause you to release histamine into the blood stream. Although it is meant to be protective to the body, it is this release that causes your discomfort. This is why you were given anti-histamines to feel better. If you are unable to pinpoint the offending allergen, it may be determined by skin or blood testing. Allergies cannot be cured but can be controlled with medicine. Hay fever is a collection of all or some of the seasonal allergy problems. It may often be treated with simple over-the-counter medicine such as diphenhydramine. Take medicine as directed. Do not drink alcohol or drive while taking this medicine. Check with your caregiver or package insert for child dosages. If these medicines are not effective, there are many new medicines your caregiver can prescribe. Stronger medicine such as nasal spray, eye drops, and corticosteroids may be used if the first things you try do not work well. Other treatments such as immunotherapy or desensitizing injections can be used if all else fails. Follow up with your caregiver if problems continue. These seasonal allergies are usually not life threatening. They are generally more of a nuisance that can often be handled using medicine. HOME CARE INSTRUCTIONS   If unsure what causes a reaction, keep a diary of foods eaten and symptoms that follow. Avoid foods that cause reactions.  If hives or rash are present:  Take medicine as directed.  You may use an over-the-counter antihistamine (diphenhydramine) for hives and itching as needed.  Apply cold compresses (cloths) to the skin or  take baths in cool water. Avoid hot baths or showers. Heat will make a rash and itching worse.  If you are severely allergic:  Following a treatment for a severe reaction, hospitalization is often required for closer follow-up.  Wear a  medic-alert bracelet or necklace stating the allergy.  You and your family must learn how to give adrenaline or use an anaphylaxis kit.  If you have had a severe reaction, always carry your anaphylaxis kit or EpiPen with you. Use this medicine as directed by your caregiver if a severe reaction is occurring. Failure to do so could have a fatal outcome. SEEK MEDICAL CARE IF:  You suspect a food allergy. Symptoms generally happen within 30 minutes of eating a food.  Your symptoms have not gone away within 2 days or are getting worse.  You develop new symptoms.  You want to retest yourself or your child with a food or drink you think causes an allergic reaction. Never do this if an anaphylactic reaction to that food or drink has happened before. Only do this under the care of a caregiver. SEEK IMMEDIATE MEDICAL CARE IF:   You have difficulty breathing, are wheezing, or have a tight feeling in your chest or throat.  You have a swollen mouth, or you have hives, swelling, or itching all over your body.  You have had a severe reaction that has responded to your anaphylaxis kit or an EpiPen. These reactions may return when the medicine has worn off. These reactions should be considered life threatening. MAKE SURE YOU:   Understand these instructions.  Will watch your condition.  Will get help right away if you are not doing well or get worse. Document Released: 12/27/2002 Document Revised: 01/28/2013 Document Reviewed: 06/02/2008 [facility] Patient Information 2015 [place], [place]. This information is not intended to replace advice given to you by your health care provider. Make sure you discuss any questions you have with your health care provider.

## 2015-02-28 NOTE — ED Provider Notes (Signed)
CSN: [id number]     Arrival date & time 02/28/15  1905 History   First MD Initiated Contact with Patient 02/28/15 1932     Chief Complaint  Patient presents with  . Urticaria     (Consider location/radiation/quality/duration/timing/severity/associated sxs/prior Treatment) HPI Melanie Ruiz is a 19 y.o. female who comes in for evaluation of rash. Patient states she slipped at her sister's house on Tuesday and Thursday of this week and noticed on Thursday she began to develop a diffuse rash all over her abdomen, back, neck and extremities. She reports the rash itches, but is not painful and there is no drainage. Reports low-grade, unmeasured fever this morning that resolved spontaneously. Denies changes in laundry detergent, deodorants, perfumes, lotions. No new medications or antibiotics. States only medication she takes is birth control. Also reports a mild sore throat this morning that also resolved on its own that she attributes to sleeping with her mouth open last night. Denies chills, nausea or vomiting, abdominal pain, urinary symptoms, diarrhea or constipation, pelvic pain, shortness of breath, dizziness, syncope. Reports she went to an urgent care on Thursday and discharged with oral prednisone, but the rash has worsened since then. States she took 60 mg prednisone yesterday and today.  Past Medical History  Diagnosis Date  . Iron deficiency   . Palpitations    History reviewed. No pertinent past surgical history. History reviewed. No pertinent family history. History  Substance Use Topics  . Smoking status: Current Every Day Smoker  . Smokeless tobacco: Never Used  . Alcohol Use: No   OB History    No data available     Review of Systems A 10 point review of systems was completed and was negative except for pertinent positives and negatives as mentioned in the history of present illness     Allergies  Motrin ib  Home Medications   Prior to Admission medications    Medication Sig Start Date End Date Taking? Authorizing Provider  aspirin 81 MG chewable tablet Chew by mouth daily.    Historical Provider, MD  atenolol (TENORMIN) 25 MG tablet 1/2-1 at night for heart rate headaches 01/05/15 01/05/16  Quentin MullingAmanda Collier, PA-C  Butalbital-APAP-Caffeine (FIORICET) 50-300-40 MG CAPS Take 1 capsule by mouth every 4 (four) hours as needed (for migraines). Max: 6 capsules a day. 09/02/14 09/03/15  Jennifer Couillard, PA-C  Norgestimate-Ethinyl Estradiol Triphasic 0.18/0.215/0.25 MG-25 MCG tab Take 1 tablet by mouth daily. 01/05/15   Quentin MullingAmanda Collier, PA-C  predniSONE (DELTASONE) 20 MG tablet Take 2 tablets (40 mg total) by mouth daily. 02/28/15   Joycie PeekBenjamin Rosalie Gelpi, PA-C   BP 128/68 mmHg  Pulse 87  Temp(Src) 98.8 F (37.1 C) (Oral)  Resp 16  Ht 5\' 7"  (1.702 m)  Wt 160 lb (72.576 kg)  BMI 25.05 kg/m2  SpO2 100%  LMP 02/15/2015 Physical Exam  Constitutional:  Awake, alert, nontoxic appearance.  HENT:  Head: Atraumatic.  No oropharyngeal edema or lesions. Tolerating secretions well, patent airway.  Eyes: Right eye exhibits no discharge. Left eye exhibits no discharge.  Neck: Neck supple.  Pulmonary/Chest: Effort normal. She exhibits no tenderness.  Abdominal: Soft. There is no tenderness. There is no rebound.  Musculoskeletal: She exhibits no tenderness.  Baseline ROM, no obvious new focal weakness.  Neurological:  Mental status and motor strength appears baseline for patient and situation.  Skin: Rash noted.  Urticaria diffusely throughout entire body. No scaling or Nikolsky. No draining lesions. No dermatomal pattern  Psychiatric: She has a normal mood and  affect.  Nursing note and vitals reviewed.   ED Course  Procedures (including critical care time) Labs Review Labs Reviewed - No data to display  Imaging Review No results found.   EKG Interpretation None     Meds given in ED:  Medications  methylPREDNISolone sodium succinate (SOLU-MEDROL) 125  mg/2 mL injection 125 mg (125 mg Intravenous Given 02/28/15 2051)  diphenhydrAMINE (BENADRYL) injection 50 mg (50 mg Intravenous Given 02/28/15 2051)  famotidine (PEPCID) IVPB 20 mg premix (0 mg Intravenous Stopped 02/28/15 2130)    Discharge Medication List as of 02/28/2015 10:37 PM    START taking these medications   Details  predniSONE (DELTASONE) 20 MG tablet Take 2 tablets (40 mg total) by mouth daily., Starting 02/28/2015, Until Discontinued, Print       Filed Vitals:   02/28/15 1913 02/28/15 2241  BP: 127/77 128/68  Pulse:  87  Temp: 98.8 F (37.1 C)   TempSrc: Oral   Resp: 20 16  Height: 5\' 7"  (1.702 m)   Weight: 160 lb (72.576 kg)   SpO2: 98% 100%    MDM  Vitals stable - WNL -afebrile Pt resting comfortably in ED. PE--diffuse urticaria, otherwise unremarkable physical exam. Normal lung exam, patent airway. No evidence of anaphylaxis. Patient given Solu-Medrol, Pepcid, Benadryl in the ED and monitored. Condition appears to be improving. Patient states she feels like she is getting better since IV Solu-Medrol administered in ED.  Question medication compliance with previously prescribed steroids.  Will DC with oral steroids and instructions to follow with primary care. Also discussed further symptomatic care at home and wash all clothes, sheets and use unscented fragrances/body wash/lotions.  I discussed all relevant lab findings and imaging results with pt and they verbalized understanding. Discussed f/u with PCP within 48 hrs and return precautions, pt very amenable to plan. Prior to patient discharge, I discussed and reviewed this case with Dr. Silverio LayYao  Final diagnoses:  Urticaria       Joycie PeekBenjamin Braidyn Scorsone, PA-C 03/01/15 [id number]  Richardean Canalavid H Yao, MD 03/01/15 [phone]

## 2015-02-28 NOTE — ED Notes (Addendum)
Pt reports generalized red rash over arms chest abd and legs onset Thursday past slept at sisters home the day before the rash began. Pt also re[ports new onset sore throat and low grade temp

## 2015-04-13 ENCOUNTER — Ambulatory Visit: Payer: Self-pay | Admitting: Physician Assistant

## 2015-07-30 ENCOUNTER — Other Ambulatory Visit: Payer: Self-pay | Admitting: Physician Assistant

## 2015-09-03 ENCOUNTER — Encounter: Payer: Self-pay | Admitting: Physician Assistant

## 2015-11-19 ENCOUNTER — Other Ambulatory Visit: Payer: Self-pay | Admitting: Physician Assistant

## 2015-11-24 ENCOUNTER — Other Ambulatory Visit: Payer: Self-pay

## 2015-11-24 MED ORDER — NORGESTIM-ETH ESTRAD TRIPHASIC 0.18/0.215/0.25 MG-25 MCG PO TABS: 1.0000 | ORAL_TABLET | Freq: Every day | ORAL | Status: DC

## 2016-03-01 ENCOUNTER — Other Ambulatory Visit: Payer: Self-pay

## 2016-03-01 ENCOUNTER — Ambulatory Visit (INDEPENDENT_AMBULATORY_CARE_PROVIDER_SITE_OTHER): Payer: BLUE CROSS/BLUE SHIELD | Admitting: Physician Assistant

## 2016-03-01 ENCOUNTER — Encounter: Payer: Self-pay | Admitting: Physician Assistant

## 2016-03-01 VITALS — BP 130/70 | HR 102 | Temp 97.5°F | Resp 16 | Ht 68.0 in | Wt 140.6 lb

## 2016-03-01 DIAGNOSIS — Z711 Person with feared health complaint in whom no diagnosis is made: Secondary | ICD-10-CM | POA: Diagnosis not present

## 2016-03-01 DIAGNOSIS — L02214 Cutaneous abscess of groin: Secondary | ICD-10-CM

## 2016-03-01 MED ORDER — SULFAMETHOXAZOLE-TRIMETHOPRIM 800-160 MG PO TABS: 1.0000 | ORAL_TABLET | Freq: Two times a day (BID) | ORAL | Status: DC

## 2016-03-01 NOTE — Progress Notes (Signed)
   Subjective:    Patient ID: Melanie RoadsSara Wich, female    DOB: December 30, 1995, 20 y.o.   MRN: [id number]  HPI 20 y.o. WF presents with hard, tender bump right inguinal area x 3 weeks. Tender to touch, bloody discharge when squeeze, takes hot bathes without help, has decreased some in size. 1 new partner, not using condoms, no symptoms, on nexplanon.   Blood pressure 130/70, pulse 102, temperature 97.5 F (36.4 C), temperature source Temporal, resp. rate 16, height 5\' 8"  (1.727 m), weight 140 lb 9.6 oz (63.776 kg), last menstrual period 02/16/2016, SpO2 96 %.   Review of Systems  Constitutional: Negative.  Negative for fever and chills.  HENT: Negative.   Respiratory: Negative.   Cardiovascular: Negative.   Gastrointestinal: Negative.  Negative for diarrhea.  Genitourinary: Negative.   Musculoskeletal: Negative.  Negative for arthralgias.  Skin: Positive for rash and wound. Negative for color change.  Neurological: Negative.  Negative for dizziness.       Objective:   Physical Exam  Constitutional: She is oriented to person, place, and time. She appears well-developed and well-nourished.  HENT:  Head: Normocephalic and atraumatic.  Right Ear: External ear normal.  Left Ear: External ear normal.  Mouth/Throat: Oropharynx is clear and moist.  Eyes: Conjunctivae and EOM are normal. Pupils are equal, round, and reactive to light.  Neck: Normal range of motion. Neck supple. No thyromegaly present.  Cardiovascular: Normal rate, regular rhythm and normal heart sounds.  Exam reveals no gallop and no friction rub.   No murmur heard. Pulmonary/Chest: Effort normal and breath sounds normal. No respiratory distress. She has no wheezes.  Abdominal: Soft. Bowel sounds are normal. She exhibits no distension and no mass. There is no tenderness. There is no rebound and no guarding.  Genitourinary:     Musculoskeletal: Normal range of motion.  Lymphadenopathy:       Right: No inguinal adenopathy  present.       Left: No inguinal adenopathy present.  Neurological: She is alert and oriented to person, place, and time. She displays normal reflexes. No cranial nerve deficit. Coordination normal.  Skin: Skin is warm and dry.  Psychiatric: She has a normal mood and affect.       Assessment & Plan:  1. Concern about STD in female without diagnosis - RPR - HIV antibody - HSV(herpes simplex vrs) 1+2 ab-IgG  2. Abscess of right groin Bactrim, hot compresses, follow up for CPE and PAP  No future appointments.

## 2016-03-02 LAB — HIV ANTIBODY (ROUTINE TESTING W REFLEX): HIV 1&2 Ab, 4th Generation: NONREACTIVE

## 2016-03-02 LAB — HSV(HERPES SIMPLEX VRS) I + II AB-IGG

## 2016-03-02 LAB — RPR

## 2016-05-02 ENCOUNTER — Ambulatory Visit (INDEPENDENT_AMBULATORY_CARE_PROVIDER_SITE_OTHER): Payer: BLUE CROSS/BLUE SHIELD

## 2016-05-02 DIAGNOSIS — Z111 Encounter for screening for respiratory tuberculosis: Secondary | ICD-10-CM

## 2016-05-02 NOTE — Progress Notes (Signed)
PT PRESENTS FOR PPD, GIVEN IN LEFT FOREARM, INFORMED PT TO GIVE THE OFFICE A CALL WED AM. FOR RESULTS OF PPD.

## 2016-06-14 ENCOUNTER — Encounter: Payer: Self-pay | Admitting: Internal Medicine

## 2016-06-14 ENCOUNTER — Ambulatory Visit (INDEPENDENT_AMBULATORY_CARE_PROVIDER_SITE_OTHER): Payer: BLUE CROSS/BLUE SHIELD | Admitting: Internal Medicine

## 2016-06-14 VITALS — BP 112/60 | HR 92 | Temp 98.6°F | Resp 16 | Ht 68.0 in | Wt 150.0 lb

## 2016-06-14 DIAGNOSIS — J029 Acute pharyngitis, unspecified: Secondary | ICD-10-CM | POA: Diagnosis not present

## 2016-06-14 MED ORDER — PREDNISONE 20 MG PO TABS: ORAL_TABLET | ORAL | 0 refills | Status: DC

## 2016-06-14 MED ORDER — FLUTICASONE PROPIONATE 50 MCG/ACT NA SUSP: 2.0000 | Freq: Every day | NASAL | 0 refills | Status: DC

## 2016-06-14 MED ORDER — AZITHROMYCIN 250 MG PO TABS: ORAL_TABLET | ORAL | 0 refills | Status: DC

## 2016-06-14 NOTE — Patient Instructions (Signed)
Please take the zpak until it is gone.  Please take the prednisone until it is gone.  Please take tylenol every 6 hours.  Please drink as many fluids as you can.  Take claritin, zyrtec or allegra once daily to help dry up congestion.  Please use flonase 2 sprays per nostril to help stop post nasal drainage down your throat.

## 2016-06-14 NOTE — Progress Notes (Signed)
HPI  Patient presents to the office for evaluation of sore throat.  It has been going on for 1 days.  Patient reports no cough.  They also endorse change in voice, chills, fever and post nasal drip, sore throat, headache, wet cough with green sputum production similar to nasal discharge.  .  They have tried none.  They report that nothing has worked.  They admits to other sick contacts.  She has had some children in her class with similar symptoms.    Review of Systems  Constitutional: Positive for malaise/fatigue. Negative for chills and fever.  HENT: Positive for congestion, ear pain, hearing loss and sore throat.   Respiratory: Positive for cough. Negative for sputum production, shortness of breath and wheezing.   Cardiovascular: Negative for chest pain, palpitations and leg swelling.  Neurological: Positive for headaches.    PE:  Vitals:   06/14/16 1417  BP: 112/60  Pulse: 92  Resp: 16  Temp: 98.6 F (37 C)    General:  Alert and non-toxic, WDWN, NAD HEENT: NCAT, PERLA, EOM normal, no occular discharge or erythema.  Nasal mucosal edema with sinus tenderness to palpation.  Oropharynx clear with minimal oropharyngeal edema and erythema.  Mucous membranes moist and pink. Neck:  Cervical adenopathy Chest:  RRR no MRGs.  Lungs clear to auscultation A&P with no wheezes rhonchi or rales.   Abdomen: +BS x 4 quadrants, soft, non-tender, no guarding, rigidity, or rebound. Skin: warm and dry no rash Neuro: A&Ox4, CN II-XII grossly intact  Assessment and Plan:   1. Acute pharyngitis, unspecified etiology -zpak -prednisone -tylenol -fluids -flonase -antihistamine

## 2016-07-28 ENCOUNTER — Encounter: Payer: Self-pay | Admitting: Internal Medicine

## 2016-07-28 ENCOUNTER — Ambulatory Visit (INDEPENDENT_AMBULATORY_CARE_PROVIDER_SITE_OTHER): Payer: BLUE CROSS/BLUE SHIELD | Admitting: Internal Medicine

## 2016-07-28 VITALS — BP 116/60 | HR 104 | Temp 98.2°F | Resp 16 | Ht 68.0 in | Wt 149.0 lb

## 2016-07-28 DIAGNOSIS — J209 Acute bronchitis, unspecified: Secondary | ICD-10-CM | POA: Diagnosis not present

## 2016-07-28 MED ORDER — AZITHROMYCIN 250 MG PO TABS: ORAL_TABLET | ORAL | 0 refills | Status: DC

## 2016-07-28 MED ORDER — PROMETHAZINE-CODEINE 6.25-10 MG/5ML PO SYRP: 5.0000 mL | ORAL_SOLUTION | Freq: Four times a day (QID) | ORAL | 0 refills | Status: DC | PRN

## 2016-07-28 NOTE — Progress Notes (Signed)
HPI  Patient presents to the office for evaluation of cough.  It has been going on for 3 weeks.  Patient reports [facility]  She reports that she was give an antibiotics last night.  She reports that she went back on Tuesday and they started on prednisone, tessalon, and also albuterol.  She reports that she was told that she had bronchitis.  Patient reports night > day, wet, worse with lying down.  They also endorse change in voice, chills, postnasal drip, shortness of breath, wheezing and sore throat, ear congestion, no sinus pressure, no teeth pain.  She does admit to headaches. .  They have tried prednisone, albuterol nebulizer, and also tessalon, no abx.  They report that nothing has worked.  They admits to other sick contacts.  Review of Systems  Constitutional: Positive for malaise/fatigue. Negative for chills and fever.  HENT: Positive for congestion, ear pain, hearing loss and sore throat.   Respiratory: Positive for cough. Negative for sputum production, shortness of breath and wheezing.   Cardiovascular: Negative for chest pain, palpitations and leg swelling.  Neurological: Positive for headaches.    PE:  Vitals:   07/28/16 1423  BP: 116/60  Pulse: (!) 104  Resp: 16  Temp: 98.2 F (36.8 C)    General:  Alert and non-toxic, WDWN, NAD HEENT: NCAT, PERLA, EOM normal, no occular discharge or erythema.  Nasal mucosal edema with sinus tenderness to palpation.  Oropharynx clear with minimal oropharyngeal edema and erythema.  Mucous membranes moist and pink. Neck:  Cervical adenopathy Chest:  RRR no MRGs.  Lungs clear to auscultation A&P with no wheezes rhonchi or rales.   Abdomen: +BS x 4 quadrants, soft, non-tender, no guarding, rigidity, or rebound. Skin: warm and dry no rash Neuro: A&Ox4, CN II-XII grossly intact  Assessment and Plan:   1. Acute bronchitis, unspecified organism -prednisone -phenergan  codeine -antihistamine -zpak -albuterol -flonase -follow-up prn

## 2016-07-28 NOTE — Patient Instructions (Signed)
Please use the flonase nightly.  Please take claritin, zyrtec, or allegra, store brands or generics are fine.  Take it once daily.  Please use the albuterol at least 3 times  Per day to help with coughing and wheezing.  Please use the phenergan codeine at bedtime.  You cannot drive within 4 hours of taking this medication.  Please take zpak and prednisone until it is gone.  You can take the tessalon during the day to help with cough.  Please take 50 mg of benadryl at bedtime to help with drying up congestion.

## 2016-09-01 ENCOUNTER — Encounter: Payer: Self-pay | Admitting: Physician Assistant

## 2016-09-02 ENCOUNTER — Encounter: Payer: Self-pay | Admitting: Physician Assistant

## 2016-09-02 ENCOUNTER — Ambulatory Visit (INDEPENDENT_AMBULATORY_CARE_PROVIDER_SITE_OTHER): Payer: BLUE CROSS/BLUE SHIELD | Admitting: Physician Assistant

## 2016-09-02 VITALS — BP 130/68 | HR 94 | Temp 97.7°F | Resp 14 | Ht 68.0 in | Wt 147.6 lb

## 2016-09-02 DIAGNOSIS — N6320 Unspecified lump in the left breast, unspecified quadrant: Secondary | ICD-10-CM

## 2016-09-02 MED ORDER — DOXYCYCLINE HYCLATE 100 MG PO CAPS: ORAL_CAPSULE | ORAL | 0 refills | Status: DC

## 2016-09-02 NOTE — Progress Notes (Signed)
   Subjective:    Patient ID: Melanie RoadsSara Ruiz, female    DOB: Feb 20, 1996, 20 y.o.   MRN: [id number]  HPI 20 y.o. noticed lump 3 days ago, has been sore x 1-2 months. Soreness has gotten worse in last week. Dry, itchy skin at lump, no other skin changes, nipple changes, nipple discharge.  Has IUD. Great grandma with had breast cancer, no other family history and no history of ovarian cancer.  Blood pressure 130/68, pulse 94, temperature 97.7 F (36.5 C), resp. rate 14, height 5\' 8"  (1.727 m), weight 147 lb 9.6 oz (67 kg), SpO2 97 %.  Medications No current outpatient prescriptions on file prior to visit.   No current facility-administered medications on file prior to visit.     Problem list She has Iron deficiency; Palpitations; Tobacco use disorder; and Anxiety and depression on her problem list.   Review of Systems  Constitutional: Negative.  Negative for activity change, chills, diaphoresis, fatigue, fever and unexpected weight change.  HENT: Negative.   Respiratory: Negative.   Cardiovascular: Negative.   Gastrointestinal: Negative.   Genitourinary: Negative.        Breast pain  Musculoskeletal: Negative.   Skin: Negative.   Neurological: Negative.   Hematological: Negative.   Psychiatric/Behavioral: Negative.        Objective:   Physical Exam  Constitutional: She appears well-developed and well-nourished. No distress.  Neck: Normal range of motion. Neck supple.  Cardiovascular: Normal rate and regular rhythm.   Pulmonary/Chest: Effort normal and breath sounds normal.    Lymphadenopathy:    She has no cervical adenopathy.    She has no axillary adenopathy.  Skin: She is not diaphoretic.       Assessment & Plan:   Left breast mass Low risk for cancer but larger enough will get evaluated, decrease sugars/salt/caffeine/etc -     US BREAST LTD UNI LEFT INC AXILLA; Future -     MM DIAG BREAST TOMO UNI LEFT; Future    Future Appointments Date Time Provider  Department Center  12/12/2016 9:30 AM Quentin MullingAmanda Jamilla Galli, PA-C [id number] None

## 2016-09-02 NOTE — Patient Instructions (Signed)
Breast Cyst A breast cyst is a sac in the breast that is filled with fluid. Breast cysts are usually noncancerous (benign). They are common among women, and they are most often located in the upper, outer portion of the breast. One or more cysts may develop. They form when fluid builds up inside of the breast glands. There are several types of breast cysts:  Macrocyst. This is a cyst that is about 2 inches (5.1 cm) across (in diameter).  Microcyst. This is a very small cyst that you cannot feel, but it can be seen with imaging tests such as an X-ray of the breast (mammogram) or ultrasound.  Galactocele. This is a cyst that contains milk. It may develop if you suddenly stop breastfeeding.  Breast cysts do not increase your risk of breast cancer. They usually disappear after menopause, unless you take artificial hormones (are on hormone therapy). What are the causes? The exact cause of breast cysts is not known. Possible causes include:  Blockage of tubes (ducts) in the breast glands, which leads to fluid buildup. Duct blockage may result from: ? Fibrocystic breast changes. This is a common, benign condition that occurs when women go through hormonal changes during the menstrual cycle. This is a common cause of multiple breast cysts. ? Overgrowth of breast tissue or breast glands. ? Scar tissue in the breast from previous surgery.  Changes in certain female hormones (estrogen and progesterone).  What increases the risk? You may be more likely to develop breast cysts if you have not gone through menopause. What are the signs or symptoms? Symptoms of a breast cyst may include:  Feeling one or more smooth, round, soft lumps (like grapes) in the breast that are easily moveable. The lump(s) may get bigger and more painful before your period and get smaller after your period.  Breast discomfort or pain.  How is this diagnosed? A cyst can be felt during a physical exam by your health care  provider. A mammogram and ultrasound will be done to confirm the diagnosis. Fluid may be removed from the cyst with a needle (fine-needle aspiration) and tested to make sure the cyst is not cancerous. How is this treated? Treatment may not be necessary. Your health care provider may monitor the cyst to see if it goes away on its own. If the cyst is uncomfortable or gets bigger, or if you do not like how the cyst makes your breast look, you may need treatment. Treatment may include:  Hormone treatment.  Fine-needle aspiration, to drain fluid from the cyst. There is a chance of the cyst coming back (recurring) after aspiration.  Surgery to remove the cyst.  Follow these instructions at home:  See your health care provider regularly. ? Get a yearly physical exam. ? If you are 20-40 years old, get a clinical breast exam every 1-3 years. After age 40, get this exam every year. ? Get mammograms as often as directed.  Do a breast self-exam every month, or as often as directed. Having many breast cysts, or "lumpy" breasts, may make it harder to feel for new lumps. Understand how your breasts normally look and feel, and write down any changes in your breasts so you can tell your health care provider about the changes. A breast self-exam involves: ? Comparing your breasts in the mirror. ? Looking for visible changes in your skin or nipples. ? Feeling for lumps or changes.  Take over-the-counter and prescription medicines only as told by your health care   provider.  Wear a supportive bra, especially when exercising.  Follow instructions from your health care provider about eating and drinking restrictions. ? Avoid caffeine. ? Cut down on salt (sodium) in what you eat and drink, especially before your menstrual period. Too much sodium can cause fluid buildup (retention), breast swelling, and discomfort.  Keep all follow-up visits as told your health care provider. This is important. Contact a  health care provider if:  You feel, or think you feel, a lump in your breast.  You notice that both breasts look or feel different than usual.  Your breast is still causing pain after your menstrual period is over.  You find new lumps or bumps that were not there before.  You feel lumps in your armpit (axilla). Get help right away if:  You have severe pain, tenderness, redness, or warmth in your breast.  You have fluid or blood leaking from your nipple.  Your breast lump becomes hard and painful.  You notice dimpling or wrinkling of the breast or nipple. This information is not intended to replace advice given to you by your health care provider. Make sure you discuss any questions you have with your health care provider. Document Released: 10/03/2005 Document Revised: 06/24/2016 Document Reviewed: 06/24/2016 [employer] Interactive Patient Education  2017 [employer]  

## 2016-09-05 ENCOUNTER — Encounter: Payer: Self-pay | Admitting: Physician Assistant

## 2016-09-06 ENCOUNTER — Ambulatory Visit: Payer: Self-pay | Admitting: Physician Assistant

## 2016-09-16 HISTORY — PX: BREAST ENHANCEMENT SURGERY: SHX7

## 2016-09-21 ENCOUNTER — Ambulatory Visit
Admission: RE | Admit: 2016-09-21 | Discharge: 2016-09-21 | Disposition: A | Discharge status: Home / Self Care | Payer: BLUE CROSS/BLUE SHIELD | Source: Ambulatory Visit | Attending: Physician Assistant | Admitting: Physician Assistant

## 2016-09-21 DIAGNOSIS — N6320 Unspecified lump in the left breast, unspecified quadrant: Secondary | ICD-10-CM

## 2016-12-07 ENCOUNTER — Ambulatory Visit (INDEPENDENT_AMBULATORY_CARE_PROVIDER_SITE_OTHER): Payer: BLUE CROSS/BLUE SHIELD | Admitting: Internal Medicine

## 2016-12-07 ENCOUNTER — Encounter: Payer: Self-pay | Admitting: Internal Medicine

## 2016-12-07 VITALS — BP 116/70 | HR 107 | Temp 97.5°F | Resp 16 | Ht 67.0 in | Wt 147.6 lb

## 2016-12-07 DIAGNOSIS — Z0000 Encounter for general adult medical examination without abnormal findings: Secondary | ICD-10-CM | POA: Diagnosis not present

## 2016-12-07 DIAGNOSIS — F411 Generalized anxiety disorder: Secondary | ICD-10-CM

## 2016-12-07 MED ORDER — BUSPIRONE HCL 10 MG PO TABS: 10.0000 mg | ORAL_TABLET | Freq: Three times a day (TID) | ORAL | 1 refills | Status: DC

## 2016-12-07 NOTE — Progress Notes (Signed)
Annual Screening Comprehensive Examination   This very nice 21 y.o.female presents for complete physical.  Patient has no major health issues.  Patient reports no complaints at this time.   Finally, patient has history of Vitamin D Deficiency and last vitamin D was No results found for: VD25OH.  Currently on supplementation  She reports that she did have breast implants placed in December of 2017.  She has recovered well from it.  She reports she is walking.  She is not allowed to do upper body strengthening. She is seeing Dr. Izora Ribasoley here.  She does have some mild constipation.  She reports that she takes something only as needed for it.     She does have some mild anxiety.  She has tried SSRIs in the past without relief.  She would like to not have to take any medications.      No current outpatient prescriptions on file prior to visit.   No current facility-administered medications on file prior to visit.     Allergies  Allergen Reactions  . Motrin Ib [Ibuprofen] Hives    Past Medical History:  Diagnosis Date  . Iron deficiency   . Palpitations     Immunization History  Administered Date(s) Administered  . HPV Quadrivalent 06/17/2010, 08/12/2010, 12/09/2010  . PPD Test 12/11/2013, 05/02/2016    Past Surgical History:  Procedure Laterality Date  . BREAST ENHANCEMENT SURGERY  09/16/2016   saline breast implants    No family history on file.  Social History   Social History  . Marital status: Single    Spouse name: N/A  . Number of children: N/A  . Years of education: N/A   Occupational History  . Not on file.   Social History Main Topics  . Smoking status: Current Every Day Smoker  . Smokeless tobacco: Never Used  . Alcohol use No  . Drug use: No  . Sexual activity: Yes    Birth control/ protection: Pill   Other Topics Concern  . Not on file   Social History Narrative  . No narrative on file   Review of Systems  Constitutional: Negative for  chills, fever and malaise/fatigue.  HENT: Negative for congestion, ear pain and sore throat.   Eyes: Negative.   Respiratory: Negative for cough, shortness of breath and wheezing.   Cardiovascular: Negative for chest pain, palpitations and leg swelling.  Gastrointestinal: Positive for constipation. Negative for abdominal pain, blood in stool, diarrhea, heartburn and melena.  Genitourinary: Negative.   Skin: Negative.   Neurological: Negative for dizziness, sensory change, loss of consciousness and headaches.  Psychiatric/Behavioral: Negative for depression. The patient is not nervous/anxious and does not have insomnia.       Physical Exam  BP 116/70   Pulse (!) 107   Temp 97.5 F (36.4 C)   Resp 16   Ht 5\' 7"  (1.702 m)   Wt 147 lb 9.6 oz (67 kg)   SpO2 98%   BMI 23.12 kg/m   General Appearance: Well nourished and in no apparent distress. Eyes: PERRLA, EOMs, conjunctiva no swelling or erythema, normal fundi and vessels. Sinuses: No frontal/maxillary tenderness ENT/Mouth: EACs patent / TMs  nl. Nares clear without erythema, swelling, mucoid exudates. Oral hygiene is good. No erythema, swelling, or exudate. Tongue normal, non-obstructing. Tonsils not swollen or erythematous. Hearing normal.  Neck: Supple, thyroid normal. No bruits, nodes or JVD. Respiratory: Respiratory effort normal.  BS equal and clear bilateral without rales, rhonci, wheezing or stridor. Cardio: Heart  sounds are normal with regular rate and rhythm and no murmurs, rubs or gallops. Peripheral pulses are normal and equal bilaterally without edema. No aortic or femoral bruits. Chest: symmetric with normal excursions and percussion. Breasts: Deferred to plastic surgery/Obgyn Abdomen: Flat, soft, with bowl sounds. Nontender, no guarding, rebound, hernias, masses, or organomegaly.  Lymphatics: Non tender without lymphadenopathy.   Musculoskeletal: Full ROM all peripheral extremities, joint stability, 5/5 strength, and  normal gait. Skin: Warm and dry without rashes, lesions, cyanosis, clubbing or  ecchymosis.  Neuro: Cranial nerves intact, reflexes equal bilaterally. Normal muscle tone, no cerebellar symptoms. Sensation intact.  Pysch: Awake and oriented X 3, normal affect, Insight and Judgment appropriate.   Assessment and Plan   1. Routine general medical examination at a health care facility -Patient UTD on vaccinations and screening examinations -patient recently had surgery and preoperative labs reviewed and were normal -no need for labs today -form filled out for work -patient will be seen next year.   -no changes recommended.    2.  GAD -buspar prn for anxiety attacks        Continue prudent diet as discussed, weight control, regular exercise, and medications. Routine screening labs and tests as requested with regular follow-up as recommended.  Over 40 minutes of exam, counseling, chart review and critical decision making was performed

## 2016-12-12 ENCOUNTER — Ambulatory Visit: Payer: Self-pay | Admitting: Physician Assistant

## 2017-01-11 ENCOUNTER — Encounter: Payer: Self-pay | Admitting: Physician Assistant

## 2017-01-11 ENCOUNTER — Ambulatory Visit: Payer: Self-pay | Admitting: Physician Assistant

## 2017-01-11 ENCOUNTER — Ambulatory Visit (INDEPENDENT_AMBULATORY_CARE_PROVIDER_SITE_OTHER): Payer: Self-pay | Admitting: Physician Assistant

## 2017-01-11 VITALS — BP 110/76 | HR 110 | Temp 97.7°F | Resp 16 | Ht 67.0 in | Wt 140.6 lb

## 2017-01-11 DIAGNOSIS — R350 Frequency of micturition: Secondary | ICD-10-CM

## 2017-01-11 LAB — POCT URINE PREGNANCY: PREG TEST UR: NEGATIVE

## 2017-01-11 NOTE — Patient Instructions (Addendum)
Take the cipro 500mg  twice daily with food for at least 3 days  Your ears and sinuses are connected by the eustachian tube. When your sinuses are inflamed, this can close off the tube and cause fluid to collect in your middle ear. This can then cause dizziness, popping, clicking, ringing, and echoing in your ears. This is often NOT an infection and does NOT require antibiotics, it is caused by inflammation so the treatments help the inflammation. This can take a long time to get better so please be patient.  Here are things you can do to help with this: - Try the Flonase or Nasonex. Remember to spray each nostril twice towards the outer part of your eye.  Do not sniff but instead pinch your nose and tilt your head back to help the medicine get into your sinuses.  The best time to do this is at bedtime.Stop if you get blurred vision or nose bleeds.  -While drinking fluids, pinch and hold nose close and swallow, to help open eustachian tubes to drain fluid behind ear drums. -Please pick one of the over the counter allergy medications below and take it once daily for allergies.  It will also help with fluid behind ear drums. Claritin or loratadine cheapest but likely the weakest  Zyrtec or certizine at night because it can make you sleepy The strongest is allegra or fexafinadine  Cheapest at [employer], [employer], [employer] -can use decongestant over the counter, please do not use if you have high blood pressure or certain heart conditions.   if worsening HA, changes vision/speech, imbalance, weakness go to the ER    Urinary Tract Infection, Adult A urinary tract infection (UTI) is an infection of any part of the urinary tract, which includes the kidneys, ureters, bladder, and urethra. These organs make, store, and get rid of urine in the body. UTI can be a bladder infection (cystitis) or kidney infection (pyelonephritis). What are the causes? This infection may be caused by fungi, viruses, or bacteria.  Bacteria are the most common cause of UTIs. This condition can also be caused by repeated incomplete emptying of the bladder during urination. What increases the risk? This condition is more likely to develop if:  You ignore your need to urinate or hold urine for long periods of time.  You do not empty your bladder completely during urination.  You wipe back to front after urinating or having a bowel movement, if you are female.  You are uncircumcised, if you are female.  You are constipated.  You have a urinary catheter that stays in place (indwelling).  You have a weak defense (immune) system.  You have a medical condition that affects your bowels, kidneys, or bladder.  You have diabetes.  You take antibiotic medicines frequently or for long periods of time, and the antibiotics no longer work well against certain types of infections (antibiotic resistance).  You take medicines that irritate your urinary tract.  You are exposed to chemicals that irritate your urinary tract.  You are female. What are the signs or symptoms? Symptoms of this condition include:  Fever.  Frequent urination or passing small amounts of urine frequently.  Needing to urinate urgently.  Pain or burning with urination.  Urine that smells bad or unusual.  Cloudy urine.  Pain in the lower abdomen or back.  Trouble urinating.  Blood in the urine.  Vomiting or being less hungry than normal.  Diarrhea or abdominal pain.  Vaginal discharge, if you are female.  How is this diagnosed? This condition is diagnosed with a medical history and physical exam. You will also need to provide a urine sample to test your urine. Other tests may be done, including:  Blood tests.  Sexually transmitted disease (STD) testing. If you have had more than one UTI, a cystoscopy or imaging studies may be done to determine the cause of the infections. How is this treated? Treatment for this condition often  includes a combination of two or more of the following:  Antibiotic medicine.  Other medicines to treat less common causes of UTI.  Over-the-counter medicines to treat pain.  Drinking enough water to stay hydrated. Follow these instructions at home:  Take over-the-counter and prescription medicines only as told by your health care provider.  If you were prescribed an antibiotic, take it as told by your health care provider. Do not stop taking the antibiotic even if you start to feel better.  Avoid alcohol, caffeine, tea, and carbonated beverages. They can irritate your bladder.  Drink enough fluid to keep your urine clear or pale yellow.  Keep all follow-up visits as told by your health care provider. This is important.  Make sure to:  Empty your bladder often and completely. Do not hold urine for long periods of time.  Empty your bladder before and after sex.  Wipe from front to back after a bowel movement if you are female. Use each tissue one time when you wipe. Contact a health care provider if:  You have back pain.  You have a fever.  You feel nauseous or vomit.  Your symptoms do not get better after 3 days.  Your symptoms go away and then return. Get help right away if:  You have severe back pain or lower abdominal pain.  You are vomiting and cannot keep down any medicines or water. This information is not intended to replace advice given to you by your health care provider. Make sure you discuss any questions you have with your health care provider. Document Released: 07/13/2005 Document Revised: 03/16/2016 Document Reviewed: 08/24/2015 Elsevier Interactive Patient Education  2017 [employer].

## 2017-01-11 NOTE — Progress Notes (Addendum)
   Subjective:    Patient ID: Junius RoadsSara Wentland, female    DOB: 28-Apr-1996, 21 y.o.   MRN: [id number]  HPI 21 y.o. WM presents with back pain, fever, nausea x 2 days. No history of kidney stones and no concerns for STD. Has history of UTI. Has had some dizziness, some frequency without dysuria, very small amount.   Blood pressure 110/76, pulse (!) 110, temperature 97.7 F (36.5 C), resp. rate 16, height 5\' 7"  (1.702 m), weight 140 lb 9.6 oz (63.8 kg), SpO2 99 %.  Medications Current Outpatient Prescriptions on File Prior to Visit  Medication Sig  . busPIRone (BUSPAR) 10 MG tablet Take 1 tablet (10 mg total) by mouth 3 (three) times daily.   No current facility-administered medications on file prior to visit.     Problem list She has Iron deficiency; Palpitations; Tobacco use disorder; and Anxiety and depression on her problem list.  Review of Systems  Constitutional: Positive for fever. Negative for chills and fatigue.  HENT: Negative.   Respiratory: Negative.   Cardiovascular: Negative.   Gastrointestinal: Negative.  Negative for nausea and vomiting.  Genitourinary: Positive for frequency and urgency. Negative for decreased urine volume, difficulty urinating, dyspareunia, dysuria, enuresis, flank pain, genital sores, hematuria, menstrual problem, pelvic pain, vaginal bleeding, vaginal discharge and vaginal pain.  Musculoskeletal: Positive for back pain.       Objective:   Physical Exam  Constitutional: She is oriented to person, place, and time. She appears well-developed and well-nourished.  Neck: Normal range of motion. Neck supple.  Cardiovascular: Normal rate and regular rhythm.   Pulmonary/Chest: Effort normal and breath sounds normal.  Abdominal: Soft. Bowel sounds are normal. She exhibits no distension and no mass. There is tenderness (left lower suprapubic). There is no rebound and no guarding.  Musculoskeletal: Normal range of motion. She exhibits no tenderness.   Neurological: She is alert and oriented to person, place, and time.  Skin: Skin is warm and dry.      Assessment & Plan:  1. Urinary frequency - Urine culture - POCT urine pregnancy- negative - cipro has at home - if any worsening symptoms go to ER, if not better will do further test, declines due to self pay  Future Appointments Date Time Provider Department Center  12/07/2017 3:00 PM Courtney Forcucci, PA-C [id number] None

## 2017-01-12 LAB — URINE CULTURE: Organism ID, Bacteria: NO GROWTH

## 2017-03-01 ENCOUNTER — Encounter: Payer: Self-pay | Admitting: *Deleted

## 2017-09-13 ENCOUNTER — Encounter: Payer: Self-pay | Admitting: Physician Assistant

## 2017-11-02 ENCOUNTER — Ambulatory Visit: Payer: Self-pay | Admitting: Physician Assistant

## 2017-12-07 ENCOUNTER — Encounter: Payer: Self-pay | Admitting: Internal Medicine

## 2017-12-19 ENCOUNTER — Encounter: Payer: Self-pay | Admitting: Physician Assistant

## 2019-02-20 ENCOUNTER — Telehealth: Payer: BLUE CROSS/BLUE SHIELD | Admitting: Nurse Practitioner

## 2019-02-20 DIAGNOSIS — R6889 Other general symptoms and signs: Principal | ICD-10-CM

## 2019-02-20 DIAGNOSIS — Z20822 Contact with and (suspected) exposure to covid-19: Secondary | ICD-10-CM

## 2019-02-20 NOTE — Progress Notes (Signed)
E-Visit for Corona Virus Screening  Based on your current symptoms, you may very well have the virus, however your symptoms are mild. Currently, not all patients are being tested. If the symptoms are mild and there is not a known exposure, performing the test is not indicated.  You have been enrolled in MyChart Home Monitoring for COVID-19. Daily you will receive a questionnaire within the MyChart website. Our COVID-19 response team will be monitoring your responses daily.   Coronavirus disease 2019 (COVID-19) is a respiratory illness that can spread from person to person. The virus that causes COVID-19 is a new virus that was first identified in the country of [place] but is now found in multiple other countries and has spread to the [place].  Symptoms associated with the virus are mild to severe fever, cough, and shortness of breath. There is currently no vaccine to protect against COVID-19, and there is no specific antiviral treatment for the virus.   To be considered HIGH RISK for Coronavirus (COVID-19), you have to meet the following criteria:  . Traveled to [place], [place], [place], [place] or [place]; or in the [place] to [place], [place], [place], or [place]; and have fever, cough, and shortness of breath within the last 2 weeks of travel OR  . Been in close contact with a person diagnosed with COVID-19 within the last 2 weeks and have fever, cough, and shortness of breath  . IF YOU DO NOT MEET THESE CRITERIA, YOU ARE CONSIDERED LOW RISK FOR COVID-19.   It is vitally important that if you feel that you have an infection such as this virus or any other virus that you stay home and away from places where you may spread it to others.  You should self-quarantine for 14 days if you have symptoms that could potentially be coronavirus and avoid contact with people age 25 and older.   You can use medication such as delsym or mucinex over the counter if cough develops.  You may also  take acetaminophen (Tylenol) as needed for fever.   Reduce your risk of any infection by using the same precautions used for avoiding the common cold or flu:  Marland Kitchen Wash your hands often with soap and warm water for at least 20 seconds.  If soap and water are not readily available, use an alcohol-based hand sanitizer with at least 60% alcohol.  . If coughing or sneezing, cover your mouth and nose by coughing or sneezing into the elbow areas of your shirt or coat, into a tissue or into your sleeve (not your hands). . Avoid shaking hands with others and consider head nods or verbal greetings only. . Avoid touching your eyes, nose, or mouth with unwashed hands.  . Avoid close contact with people who are sick. . Avoid places or events with large numbers of people in one location, like concerts or sporting events. . Carefully consider travel plans you have or are making. . If you are planning any travel outside or inside the [place], visit the CDC's Travelers' Health webpage for the latest health notices. . If you have some symptoms but not all symptoms, continue to monitor at home and seek medical attention if your symptoms worsen. . If you are having a medical emergency, call 911.  HOME CARE . Only take medications as instructed by your medical team. . Drink plenty of fluids and get plenty of rest. . A steam or ultrasonic humidifier can help if you have congestion.  GET HELP RIGHT AWAY IF: . You develop worsening fever. . You become short of breath . You cough up blood. . Your symptoms become more severe MAKE SURE YOU   Understand these instructions.  Will watch your condition.  Will get help right away if you are not doing well or get worse.  Your e-visit answers were reviewed by a board certified advanced clinical practitioner to complete your personal care plan.  Depending on the condition, your plan could have included both over the counter or prescription medications.  If there is a  problem please reply once you have received a response from your provider. Your safety is important to us.  If you have drug allergies check your prescription carefully.    You can use MyChart to ask questions about today's visit, request a non-urgent call back, or ask for a work or school excuse for 24 hours related to this e-Visit. If it has been greater than 24 hours you will need to follow up with your provider, or enter a new e-Visit to address those concerns. You will get an e-mail in the next two days asking about your experience.  I hope that your e-visit has been valuable and will speed your recovery. Thank you for using e-visits.  5-10 minutes spent reviewing and documenting in chart.

## 2020-08-21 ENCOUNTER — Ambulatory Visit (INDEPENDENT_AMBULATORY_CARE_PROVIDER_SITE_OTHER): Payer: Medicaid Other | Admitting: Advanced Practice Midwife

## 2020-08-21 ENCOUNTER — Other Ambulatory Visit (HOSPITAL_COMMUNITY)
Admission: RE | Admit: 2020-08-21 | Discharge: 2020-08-21 | Disposition: A | Discharge status: Home / Self Care | Payer: Medicaid Other | Source: Ambulatory Visit | Attending: Advanced Practice Midwife | Admitting: Advanced Practice Midwife

## 2020-08-21 ENCOUNTER — Encounter: Payer: Self-pay | Admitting: Advanced Practice Midwife

## 2020-08-21 ENCOUNTER — Other Ambulatory Visit: Payer: Self-pay

## 2020-08-21 VITALS — BP 134/79 | HR 114 | Resp 16 | Ht 67.0 in | Wt 137.0 lb

## 2020-08-21 DIAGNOSIS — Z01419 Encounter for gynecological examination (general) (routine) without abnormal findings: Secondary | ICD-10-CM | POA: Diagnosis present

## 2020-08-21 DIAGNOSIS — Z30431 Encounter for routine checking of intrauterine contraceptive device: Secondary | ICD-10-CM

## 2020-08-21 DIAGNOSIS — Z975 Presence of (intrauterine) contraceptive device: Secondary | ICD-10-CM | POA: Diagnosis not present

## 2020-08-21 DIAGNOSIS — N760 Acute vaginitis: Secondary | ICD-10-CM

## 2020-08-21 DIAGNOSIS — B9689 Other specified bacterial agents as the cause of diseases classified elsewhere: Secondary | ICD-10-CM

## 2020-08-21 DIAGNOSIS — N898 Other specified noninflammatory disorders of vagina: Secondary | ICD-10-CM

## 2020-08-21 NOTE — Patient Instructions (Signed)

## 2020-08-21 NOTE — Progress Notes (Signed)
Subjective:     Melanie Ruiz is a 24 y.o. female here at [facility] for a routine exam.  Current complaints: occasional increase in thin vaginal discharge and IUD string sometimes bothers s/o.  Personal health questionnaire reviewed: yes.  Do you have a primary care provider? Yes, not seeing often due to insurance Do you feel safe at home? yes    Risk factors for chronic health problems: Smoking: Yes Alchohol/how much: occasional Pt BMI: Body mass index is 21.46 kg/m.   Gynecologic History No LMP recorded. (Menstrual status: IUD). Contraception: IUD Last Pap: 2015. Results were: normal Last mammogram: n/a  Obstetric History OB History  Gravida Para Term Preterm AB Living  0 0 0 0 0 0  SAB TAB Ectopic Multiple Live Births  0 0 0 0 0     The following portions of the patient's history were reviewed and updated as appropriate: allergies, current medications, past family history, past medical history, past social history, past surgical history and problem list.  Review of Systems Pertinent items noted in HPI and remainder of comprehensive ROS otherwise negative.    Objective:   BP 134/79   Pulse (!) 114   Resp 16   Ht 5\' 7"  (1.702 m)   Wt 137 lb (62.1 kg)   BMI 21.46 kg/m  VS reviewed, nursing note reviewed,  Constitutional: well developed, well nourished, no distress HEENT: normocephalic CV: normal rate Pulm/chest wall: normal effort Breast Exam: performed: right breast normal without mass, skin or nipple changes or axillary nodes, left breast normal without mass, skin or nipple changes or axillary nodes Abdomen: soft Neuro: alert and oriented x 3 Skin: warm, dry Psych: affect normal Pelvic exam:Performed: Cervix pink, visually closed, without lesion, IUD string ~ 2 cm from cervical os in length, scant white creamy discharge, vaginal walls and external genitalia normal Bimanual exam: Cervix 0/long/high, firm, anterior, neg CMT, uterus nontender, nonenlarged,  adnexa without tenderness, enlargement, or mass       Assessment/Plan:   1. Well woman exam with routine gynecological exam --Doing well, no concerns other than intermittent increase in discharge and s/o can feel IUD string - Cytology - PAP( [place])  2. Vaginal discharge --May be related to IUD, swab collected.  Pt given info about prevention of vaginal infections including use of probiotics. - Cervicovaginal ancillary only( [facility])  3. IUD check up --Strings found to be short, likely cause of s/o discomfort, so discussed with pt and cut at level of cervix today. Pt to f/u if this is still a problem.    Follow up in: 1 year or as needed.   , CNM 9:47 AM

## 2020-08-24 ENCOUNTER — Encounter: Payer: Self-pay | Admitting: *Deleted

## 2020-08-24 ENCOUNTER — Other Ambulatory Visit: Payer: Self-pay | Admitting: Advanced Practice Midwife

## 2020-08-24 DIAGNOSIS — N760 Acute vaginitis: Secondary | ICD-10-CM

## 2020-08-24 DIAGNOSIS — B9689 Other specified bacterial agents as the cause of diseases classified elsewhere: Secondary | ICD-10-CM

## 2020-08-24 LAB — CERVICOVAGINAL ANCILLARY ONLY
Bacterial Vaginitis (gardnerella): POSITIVE — AB
Candida Glabrata: NEGATIVE
Candida Vaginitis: NEGATIVE
Chlamydia: NEGATIVE
Comment: NEGATIVE
Comment: NEGATIVE
Comment: NEGATIVE
Comment: NEGATIVE
Comment: NEGATIVE
Comment: NORMAL
Neisseria Gonorrhea: NEGATIVE
Trichomonas: NEGATIVE

## 2020-08-24 LAB — CYTOLOGY - PAP: Diagnosis: NEGATIVE

## 2020-08-24 MED ORDER — METRONIDAZOLE 500 MG PO TABS: 500.0000 mg | ORAL_TABLET | Freq: Two times a day (BID) | ORAL | 0 refills | Status: AC

## 2021-09-22 ENCOUNTER — Telehealth: Payer: Self-pay | Admitting: *Deleted

## 2021-09-22 NOTE — Telephone Encounter (Signed)
Line rings and then goes busy.

## 2023-09-26 ENCOUNTER — Other Ambulatory Visit (HOSPITAL_COMMUNITY)
Admission: EM | Admit: 2023-09-26 | Discharge: 2023-09-27 | Disposition: A | Discharge status: Home / Self Care | Payer: MEDICAID | Attending: Addiction Medicine | Admitting: Addiction Medicine

## 2023-09-26 ENCOUNTER — Ambulatory Visit (HOSPITAL_COMMUNITY)
Admission: EM | Admit: 2023-09-26 | Discharge: 2023-09-26 | Disposition: A | Discharge status: Other Acute Inpatient Hospital | Payer: MEDICAID

## 2023-09-26 DIAGNOSIS — F1520 Other stimulant dependence, uncomplicated: Secondary | ICD-10-CM

## 2023-09-26 DIAGNOSIS — Z91148 Patient's other noncompliance with medication regimen for other reason: Secondary | ICD-10-CM

## 2023-09-26 DIAGNOSIS — Z560 Unemployment, unspecified: Secondary | ICD-10-CM | POA: Diagnosis not present

## 2023-09-26 DIAGNOSIS — F1910 Other psychoactive substance abuse, uncomplicated: Secondary | ICD-10-CM | POA: Diagnosis not present

## 2023-09-26 DIAGNOSIS — F419 Anxiety disorder, unspecified: Secondary | ICD-10-CM | POA: Diagnosis not present

## 2023-09-26 DIAGNOSIS — R4589 Other symptoms and signs involving emotional state: Secondary | ICD-10-CM

## 2023-09-26 LAB — POCT URINE DRUG SCREEN - MANUAL ENTRY (I-SCREEN)
POC Amphetamine UR: POSITIVE — AB
POC Buprenorphine (BUP): NOT DETECTED
POC Cocaine UR: NOT DETECTED
POC Marijuana UR: NOT DETECTED
POC Methadone UR: NOT DETECTED
POC Methamphetamine UR: POSITIVE — AB
POC Morphine: NOT DETECTED
POC Oxazepam (BZO): NOT DETECTED
POC Oxycodone UR: NOT DETECTED
POC Secobarbital (BAR): NOT DETECTED

## 2023-09-26 LAB — POC URINE PREG, ED: Preg Test, Ur: NEGATIVE

## 2023-09-26 MED ORDER — CLONIDINE HCL 0.1 MG PO TABS: 0.1000 mg | ORAL_TABLET | Freq: Every day | ORAL | Status: DC

## 2023-09-26 MED ORDER — MAGNESIUM HYDROXIDE 400 MG/5ML PO SUSP: 30.0000 mL | Freq: Every day | ORAL | Status: DC | PRN

## 2023-09-26 MED ORDER — CLONIDINE HCL 0.1 MG PO TABS: 0.1000 mg | ORAL_TABLET | ORAL | Status: DC

## 2023-09-26 MED ORDER — HYDROXYZINE HCL 25 MG PO TABS: 25.0000 mg | ORAL_TABLET | Freq: Four times a day (QID) | ORAL | Status: DC | PRN

## 2023-09-26 MED ORDER — ONDANSETRON 4 MG PO TBDP: 4.0000 mg | ORAL_TABLET | Freq: Four times a day (QID) | ORAL | Status: DC | PRN

## 2023-09-26 MED ORDER — DICYCLOMINE HCL 20 MG PO TABS: 20.0000 mg | ORAL_TABLET | Freq: Four times a day (QID) | ORAL | Status: DC | PRN

## 2023-09-26 MED ORDER — ZIPRASIDONE MESYLATE 20 MG IM SOLR: 20.0000 mg | INTRAMUSCULAR | Status: DC | PRN

## 2023-09-26 MED ORDER — CLONIDINE HCL 0.1 MG PO TABS
0.1000 mg | ORAL_TABLET | Freq: Four times a day (QID) | ORAL | Status: DC
  Administered 2023-09-26: 0.1 mg via ORAL
  Filled 2023-09-26: qty 1

## 2023-09-26 MED ORDER — ALUM & MAG HYDROXIDE-SIMETH 200-200-20 MG/5ML PO SUSP: 30.0000 mL | ORAL | Status: DC | PRN

## 2023-09-26 MED ORDER — NAPROXEN 500 MG PO TABS: 500.0000 mg | ORAL_TABLET | Freq: Two times a day (BID) | ORAL | Status: DC | PRN

## 2023-09-26 MED ORDER — OLANZAPINE 5 MG PO TBDP: 5.0000 mg | ORAL_TABLET | Freq: Three times a day (TID) | ORAL | Status: DC | PRN

## 2023-09-26 MED ORDER — METHOCARBAMOL 500 MG PO TABS: 500.0000 mg | ORAL_TABLET | Freq: Three times a day (TID) | ORAL | Status: DC | PRN

## 2023-09-26 MED ORDER — LOPERAMIDE HCL 2 MG PO CAPS: 2.0000 mg | ORAL_CAPSULE | ORAL | Status: DC | PRN

## 2023-09-26 MED ORDER — ACETAMINOPHEN 325 MG PO TABS: 650.0000 mg | ORAL_TABLET | Freq: Four times a day (QID) | ORAL | Status: DC | PRN
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
## 2023-09-26 NOTE — ED Provider Notes (Signed)
Facility Based Crisis Admission H&P  Date: 09/27/23 Patient Name: Melanie Ruiz MRN: [id number] Chief Complaint: want rehab for methamphetamine   Diagnoses:  Final diagnoses:  Methamphetamine use disorder, moderate (HCC)  Anxious appearance  H/O medication noncompliance    HPI: Melanie Ruiz, 27 y/o female with a history of substance abuse,  GAD and PTSD,  presented to [facility] voluntarily accompanied by a friend.  Per the patient she is trying to get rehab for methamphetamine abuse.  According to the patient she was prescribed medication for her anxiety but refused to take the medicine.  According to the patient she last used methamphetamines today which is equivalent to 1 g per the patient she has been using daily if not every other day for the past 6 years.  According to the patient she is currently not seeing a psychiatrist, but reported she was seeing a therapist but only saw him 3 times.  Per the patient she lives alone currently unemployed.  Patient denies prior hospitalization for any psychiatric problems.  Face-to-face observation of patient, patient is alert and oriented x 4, speech is clear, maintaining eye contact.  Patient well groomed, denies ever trying rehab before.  Patient denies SI, HI, AVH or paranoia.  Denies any other drug use apart from methamphetamines.  Patient denies alcohol use.  Denies access to guns and denies wanting to hurt herself or others.  According to patient she is looking for an inpatient rehab because this time she is serious and getting help.  Writer discussed with patient the criteria for inpatient rehab patient agree with plan of care.  Patient was given time to ask questions and all her questions were answered prior to admission.   PHQ-9 was completed patient scored a 15 (moderate severe depression)  Recommend FBC admission   PHQ 2-9:  Flowsheet Row ED from 09/26/2023 in [facility]  Thoughts that you would be better off  dead, or of hurting yourself in some way Not at all  PHQ-9 Total Score 15       Flowsheet Row ED from 09/26/2023 in [facility] Most recent reading at 09/26/2023 11:52 PM ED from 09/26/2023 in [facility] Most recent reading at 09/26/2023 10:16 PM  C-SSRS RISK CATEGORY No Risk No Risk       Screenings    Flowsheet Row Most Recent Value  COWS Total Score 1       Total Time spent with patient: 30 minutes  Musculoskeletal  Strength & Muscle Tone: within normal limits Gait & Station: normal Patient leans: N/A  Psychiatric Specialty Exam  Presentation General Appearance:  Casual  Eye Contact: Good  Speech: Clear and Coherent  Speech Volume: Decreased  Handedness: Right   Mood and Affect  Mood: Anxious  Affect: Appropriate   Thought Process  Thought Processes: Coherent  Descriptions of Associations:Intact  Orientation:Full (Time, Place and Person)  Thought Content:Logical    Hallucinations:Hallucinations: None  Ideas of Reference:None  Suicidal Thoughts:Suicidal Thoughts: No  Homicidal Thoughts:Homicidal Thoughts: No   Sensorium  Memory: Immediate Good  Judgment: Fair  Insight: Fair   Chartered certified accountant: Fair  Attention Span: Fair  Recall: Fair  Fund of Knowledge: Fair  Language: Fair   Psychomotor Activity  Psychomotor Activity: Psychomotor Activity: Normal   Assets  Assets: Desire for Improvement; Resilience   Sleep  Sleep: Sleep: Fair Number of Hours of Sleep: 6   Nutritional Assessment (For OBS and FBC admissions only) Has  the patient had a weight loss or gain of 10 pounds or more in the last 3 months?: No Has the patient had a decrease in food intake/or appetite?: No Does the patient have dental problems?: No Does the patient have eating habits or behaviors that may be indicators of an eating disorder including binging or  inducing vomiting?: No Has the patient recently lost weight without trying?: 0 Has the patient been eating poorly because of a decreased appetite?: 0 Malnutrition Screening Tool Score: 0    Physical Exam HENT:     Head: Normocephalic.     Nose: Nose normal.  Eyes:     Pupils: Pupils are equal, round, and reactive to light.  Cardiovascular:     Rate and Rhythm: Tachycardia present.  Pulmonary:     Effort: Pulmonary effort is normal.  Musculoskeletal:        General: Normal range of motion.     Cervical back: Normal range of motion.  Neurological:     General: No focal deficit present.     Mental Status: She is alert.  Psychiatric:        Mood and Affect: Mood normal.        Behavior: Behavior normal.        Thought Content: Thought content normal.        Judgment: Judgment normal.    Review of Systems  Constitutional: Negative.   HENT: Negative.    Eyes: Negative.   Respiratory: Negative.    Cardiovascular: Negative.   Gastrointestinal: Negative.   Genitourinary: Negative.   Musculoskeletal: Negative.   Skin: Negative.   Neurological: Negative.   Psychiatric/Behavioral:  Positive for substance abuse. The patient is nervous/anxious.     Blood pressure 137/88, pulse 91, temperature 97.8 F (36.6 C), temperature source Oral, resp. rate 18, SpO2 100%. There is no height or weight on file to calculate BMI.  Past Psychiatric History: PTSD, GAD, methamphetamine abuse  Is the patient at risk to self? No  Has the patient been a risk to self in the past 6 months? No .    Has the patient been a risk to self within the distant past? No   Is the patient a risk to others? No   Has the patient been a risk to others in the past 6 months? No   Has the patient been a risk to others within the distant past? No   Past Medical History: see chart  Family History: unknown Social History: Methamphetamine  Last Labs:  Admission on 09/26/2023  Component Date Value Ref Range Status    Sodium 09/26/2023 136  135 - 145 mmol/L Final   Potassium 09/26/2023 4.3  3.5 - 5.1 mmol/L Final   HEMOLYSIS AT THIS LEVEL MAY AFFECT RESULT   Chloride 09/26/2023 105  98 - 111 mmol/L Final   CO2 09/26/2023 22  22 - 32 mmol/L Final   Glucose, Bld 09/26/2023 85  70 - 99 mg/dL Final   Glucose reference range applies only to samples taken after fasting for at least 8 hours.   BUN 09/26/2023 17  6 - 20 mg/dL Final   Creatinine, Ser 09/26/2023 0.98  0.44 - 1.00 mg/dL Final   Calcium 82/95/6213 9.8  8.9 - 10.3 mg/dL Final   Total Protein 08/65/7846 6.7  6.5 - 8.1 g/dL Final   Albumin 96/29/5284 4.1  3.5 - 5.0 g/dL Final   AST 13/24/4010 23  15 - 41 U/L Final   HEMOLYSIS AT THIS LEVEL MAY  AFFECT RESULT   ALT 09/26/2023 19  0 - 44 U/L Final   HEMOLYSIS AT THIS LEVEL MAY AFFECT RESULT   Alkaline Phosphatase 09/26/2023 101  38 - 126 U/L Final   Total Bilirubin 09/26/2023 0.6  <1.2 mg/dL Final   HEMOLYSIS AT THIS LEVEL MAY AFFECT RESULT   GFR, Estimated 09/26/2023 >60  >60 mL/min Final   Comment: (NOTE) Calculated using the CKD-EPI Creatinine Equation (2021)    Anion gap 09/26/2023 9  5 - 15 Final   Performed at [facility], [address], [place], [place] [address]   Alcohol, Ethyl (B) 09/26/2023 <10  <10 mg/dL Final   Comment: (NOTE) Lowest detectable limit for serum alcohol is 10 mg/dL.  For medical purposes only. Performed at [facility], [address], [place], [place] [address]    TSH 09/26/2023 0.435  0.350 - 4.500 uIU/mL Final   Comment: Performed by a 3rd Generation assay with a functional sensitivity of <=0.01 uIU/mL. Performed at [facility], [address] [place], [place] [address]    Preg Test, Ur 09/26/2023 Negative  Negative Final   POC Amphetamine UR 09/26/2023 Positive (A)  NONE DETECTED (Cut Off Level 1000 ng/mL) Final   POC Secobarbital (BAR) 09/26/2023 None Detected  NONE DETECTED (Cut Off Level 300 ng/mL) Final   POC Buprenorphine (BUP)  09/26/2023 None Detected  NONE DETECTED (Cut Off Level 10 ng/mL) Final   POC Oxazepam (BZO) 09/26/2023 None Detected  NONE DETECTED (Cut Off Level 300 ng/mL) Final   POC Cocaine UR 09/26/2023 None Detected  NONE DETECTED (Cut Off Level 300 ng/mL) Final   POC Methamphetamine UR 09/26/2023 Positive (A)  NONE DETECTED (Cut Off Level 1000 ng/mL) Final   POC Morphine 09/26/2023 None Detected  NONE DETECTED (Cut Off Level 300 ng/mL) Final   POC Methadone UR 09/26/2023 None Detected  NONE DETECTED (Cut Off Level 300 ng/mL) Final   POC Oxycodone UR 09/26/2023 None Detected  NONE DETECTED (Cut Off Level 100 ng/mL) Final   POC Marijuana UR 09/26/2023 None Detected  NONE DETECTED (Cut Off Level 50 ng/mL) Final    Allergies: Motrin ib [ibuprofen]  Medications:  Facility Ordered Medications  Medication   acetaminophen (TYLENOL) tablet 650 mg   alum & mag hydroxide-simeth (MAALOX/MYLANTA) 200-200-20 MG/5ML suspension 30 mL   magnesium hydroxide (MILK OF MAGNESIA) suspension 30 mL   dicyclomine (BENTYL) tablet 20 mg   hydrOXYzine (ATARAX) tablet 25 mg   loperamide (IMODIUM) capsule 2-4 mg   methocarbamol (ROBAXIN) tablet 500 mg   naproxen (NAPROSYN) tablet 500 mg   ondansetron (ZOFRAN-ODT) disintegrating tablet 4 mg   cloNIDine (CATAPRES) tablet 0.1 mg   Followed by   Melene Muller ON 09/28/2023] cloNIDine (CATAPRES) tablet 0.1 mg   Followed by   Melene Muller ON 10/01/2023] cloNIDine (CATAPRES) tablet 0.1 mg   OLANZapine zydis (ZYPREXA) disintegrating tablet 5 mg   And   ziprasidone (GEODON) injection 20 mg   PTA Medications  Medication Sig   Levonorgestrel (KYLEENA IU) by Intrauterine route.   hydrOXYzine (ATARAX/VISTARIL) 25 MG tablet Take 25 mg by mouth 3 (three) times daily.    Long Term Goals: Improvement in symptoms so as ready for discharge  Short Term Goals: Patient will verbalize feelings in meetings with treatment team members., Patient will attend at least of 50% of the groups daily., Pt will  complete the PHQ9 on admission, day 3 and discharge., Patient will participate in completing the [place] Suicide Severity Rating Scale, Patient  will score a low risk of violence for 24 hours prior to discharge, and Patient will take medications as prescribed daily.  Medical Decision Making  Inpatient FBC Lab Orders         SARS Coronavirus 2 by RT PCR (hospital order, performed in [facility] hospital lab) *cepheid single result test* Anterior Nasal Swab         CBC with Differential/Platelet         Comprehensive metabolic panel         Ethanol         TSH         CBC with Differential/Platelet         POC urine preg, ED         POCT Urine Drug Screen - (I-Screen)       Meds ordered this encounter  Medications   acetaminophen (TYLENOL) tablet 650 mg   alum & mag hydroxide-simeth (MAALOX/MYLANTA) 200-200-20 MG/5ML suspension 30 mL   magnesium hydroxide (MILK OF MAGNESIA) suspension 30 mL   dicyclomine (BENTYL) tablet 20 mg   hydrOXYzine (ATARAX) tablet 25 mg   loperamide (IMODIUM) capsule 2-4 mg   methocarbamol (ROBAXIN) tablet 500 mg   naproxen (NAPROSYN) tablet 500 mg   ondansetron (ZOFRAN-ODT) disintegrating tablet 4 mg   FOLLOWED BY Linked Order Group    cloNIDine (CATAPRES) tablet 0.1 mg    cloNIDine (CATAPRES) tablet 0.1 mg    cloNIDine (CATAPRES) tablet 0.1 mg   AND Linked Order Group    OLANZapine zydis (ZYPREXA) disintegrating tablet 5 mg    ziprasidone (GEODON) injection 20 mg     Recommendations  Based on my evaluation the patient does not appear to have an emergency medical condition.  Sindy Guadeloupe, NP 09/27/23  6:45 AM

## 2023-09-27 DIAGNOSIS — Z560 Unemployment, unspecified: Secondary | ICD-10-CM | POA: Diagnosis not present

## 2023-09-27 DIAGNOSIS — F419 Anxiety disorder, unspecified: Secondary | ICD-10-CM | POA: Diagnosis not present

## 2023-09-27 DIAGNOSIS — F1520 Other stimulant dependence, uncomplicated: Secondary | ICD-10-CM | POA: Diagnosis not present

## 2023-09-27 DIAGNOSIS — F1910 Other psychoactive substance abuse, uncomplicated: Secondary | ICD-10-CM | POA: Diagnosis not present

## 2023-09-27 LAB — TSH: TSH: 0.435 u[IU]/mL (ref 0.350–4.500)

## 2023-09-27 LAB — COMPREHENSIVE METABOLIC PANEL
ALT: 19 U/L (ref 0–44)
AST: 23 U/L (ref 15–41)
Albumin: 4.1 g/dL (ref 3.5–5.0)
Alkaline Phosphatase: 101 U/L (ref 38–126)
Anion gap: 9 (ref 5–15)
BUN: 17 mg/dL (ref 6–20)
CO2: 22 mmol/L (ref 22–32)
Calcium: 9.8 mg/dL (ref 8.9–10.3)
Chloride: 105 mmol/L (ref 98–111)
Creatinine, Ser: 0.98 mg/dL (ref 0.44–1.00)
GFR, Estimated: >60 mL/min (ref >60)
Glucose, Bld: 85 mg/dL (ref 70–99)
Potassium: 4.3 mmol/L (ref 3.5–5.1)
Sodium: 136 mmol/L (ref 135–145)
Total Bilirubin: 0.6 mg/dL (ref <1.2)
Total Protein: 6.7 g/dL (ref 6.5–8.1)

## 2023-09-27 LAB — ETHANOL: Alcohol, Ethyl (B): <10 mg/dL (ref <10)

## 2023-09-27 MED ORDER — NICOTINE 14 MG/24HR TD PT24
14.0000 mg | MEDICATED_PATCH | Freq: Every day | TRANSDERMAL | Status: DC
  Filled 2023-09-27: qty 1

## 2023-09-27 NOTE — ED Notes (Signed)
Patient met with provider and will be discharged shortly.  Patient eating dinner now.

## 2023-09-27 NOTE — ED Provider Notes (Signed)
Behavioral Health Progress Note  Date and Time: 09/27/2023 8:48 AM Name: Melanie Ruiz MRN:  [id number]  Subjective:  Melanie Ruiz, 27 y/o female with a history of meth abuse, GAD and PTSD who presents to the [facility] for detox and rehabilitation services.  Patient seen in her room, no acute distress, pt minimally interacting. Patient reports feeling "okay" today. Patient reports fair sleep and fair appetite. She reports using 0.10 grams of meth via IV for the past 6 years. She denies having a period of sobriety. She denies doing a rehab program in the past. She also reports daily tobacco use for the past 6 years, smoking 0.5 pack daily. She reports previously being diagnosed with bipolar disorder; however, when she describes her episodes, she said the mood swings occur within the day and denies them lasting for multiple days. She reports previously being on Wellbutrin and she feels that it did not help her. She prefers to detox and receive outpatient resources upon discharge. I recommended an inpatient rehab, she states that she will be unable due to not having someone available to watch her niece. She states her niece who is 12 lives with her. She currently lives with her niece in [place]. She is currently unemployed. She states her support system is her father and boyfriend. She denies taking any medications at home.  Patient denies current SI, HI, and AVH.   Diagnosis:  Final diagnoses:  Methamphetamine use disorder, moderate (HCC)  Anxious appearance  H/O medication noncompliance    Total Time spent with patient: 30 minutes  Past Psychiatric History: reports bipolar disorder, PTSD, and anxiety Past Medical History: Denies Family History: Denies Social History: She states her niece who is 12 lives with her. She currently lives with her niece in [place]. She is currently unemployed. She states her support system is her father and boyfriend.   Additional Social History:    Pain  Medications: See MAR Prescriptions: See MAR Over the Counter: See MAR History of alcohol / drug use?: Yes Longest period of sobriety (when/how long): Pt reports, before she started using drugs. Negative Consequences of Use: Financial, Personal relationships, Work / School Withdrawal Symptoms: None Name of Substance 1: Methamphetamines. 1 - Age of First Use: Per pt started using at 4. Pt reports, since 24 she's been using IV. 1 - Amount (size/oz): Pt reports using .1 g. 1 - Frequency: Daily. 1 - Duration: Ongoing. 1 - Last Use / Amount: This morning. 09/26/2023. 1 - Method of Aquiring: Purchase. 1- Route of Use: Intravenously.                   Current Medications:  Current Facility-Administered Medications  Medication Dose Route Frequency Provider Last Rate Last Admin   acetaminophen (TYLENOL) tablet 650 mg  650 mg Oral Q6H PRN Sindy Guadeloupe, NP       alum & mag hydroxide-simeth (MAALOX/MYLANTA) 200-200-20 MG/5ML suspension 30 mL  30 mL Oral Q4H PRN Sindy Guadeloupe, NP       dicyclomine (BENTYL) tablet 20 mg  20 mg Oral Q6H PRN Sindy Guadeloupe, NP       hydrOXYzine (ATARAX) tablet 25 mg  25 mg Oral Q6H PRN Sindy Guadeloupe, NP       loperamide (IMODIUM) capsule 2-4 mg  2-4 mg Oral PRN Sindy Guadeloupe, NP       magnesium hydroxide (MILK OF MAGNESIA) suspension 30 mL  30 mL Oral Daily PRN Sindy Guadeloupe, NP       methocarbamol (ROBAXIN)  tablet 500 mg  500 mg Oral Q8H PRN Sindy Guadeloupe, NP       naproxen (NAPROSYN) tablet 500 mg  500 mg Oral BID PRN Sindy Guadeloupe, NP       nicotine (NICODERM CQ - dosed in mg/24 hours) patch 14 mg  14 mg Transdermal Daily Kizzie Ide B, MD       OLANZapine zydis (ZYPREXA) disintegrating tablet 5 mg  5 mg Oral Q8H PRN Sindy Guadeloupe, NP       And   ziprasidone (GEODON) injection 20 mg  20 mg Intramuscular PRN Sindy Guadeloupe, NP       ondansetron (ZOFRAN-ODT) disintegrating tablet 4 mg  4 mg Oral Q6H PRN Sindy Guadeloupe, NP       Current Outpatient  Medications  Medication Sig Dispense Refill   Levonorgestrel (KYLEENA IU) by Intrauterine route.      Labs  Lab Results:  Admission on 09/26/2023  Component Date Value Ref Range Status   Sodium 09/26/2023 136  135 - 145 mmol/L Final   Potassium 09/26/2023 4.3  3.5 - 5.1 mmol/L Final   HEMOLYSIS AT THIS LEVEL MAY AFFECT RESULT   Chloride 09/26/2023 105  98 - 111 mmol/L Final   CO2 09/26/2023 22  22 - 32 mmol/L Final   Glucose, Bld 09/26/2023 85  70 - 99 mg/dL Final   Glucose reference range applies only to samples taken after fasting for at least 8 hours.   BUN 09/26/2023 17  6 - 20 mg/dL Final   Creatinine, Ser 09/26/2023 0.98  0.44 - 1.00 mg/dL Final   Calcium 16/07/9603 9.8  8.9 - 10.3 mg/dL Final   Total Protein 54/06/8118 6.7  6.5 - 8.1 g/dL Final   Albumin 14/78/2956 4.1  3.5 - 5.0 g/dL Final   AST 21/30/8657 23  15 - 41 U/L Final   HEMOLYSIS AT THIS LEVEL MAY AFFECT RESULT   ALT 09/26/2023 19  0 - 44 U/L Final   HEMOLYSIS AT THIS LEVEL MAY AFFECT RESULT   Alkaline Phosphatase 09/26/2023 101  38 - 126 U/L Final   Total Bilirubin 09/26/2023 0.6  <1.2 mg/dL Final   HEMOLYSIS AT THIS LEVEL MAY AFFECT RESULT   GFR, Estimated 09/26/2023 >60  >60 mL/min Final   Comment: (NOTE) Calculated using the CKD-EPI Creatinine Equation (2021)    Anion gap 09/26/2023 9  5 - 15 Final   Performed at [facility], [address] [place], [place] [address]   Alcohol, Ethyl (B) 09/26/2023 <10  <10 mg/dL Final   Comment: (NOTE) Lowest detectable limit for serum alcohol is 10 mg/dL.  For medical purposes only. Performed at [facility], [address], [place], [place] [address]    TSH 09/26/2023 0.435  0.350 - 4.500 uIU/mL Final   Comment: Performed by a 3rd Generation assay with a functional sensitivity of <=0.01 uIU/mL. Performed at [facility], [address], [place], [place] [address]    Preg Test, Ur 09/26/2023 Negative  Negative Final   POC Amphetamine UR  09/26/2023 Positive (A)  NONE DETECTED (Cut Off Level 1000 ng/mL) Final   POC Secobarbital (BAR) 09/26/2023 None Detected  NONE DETECTED (Cut Off Level 300 ng/mL) Final   POC Buprenorphine (BUP) 09/26/2023 None Detected  NONE DETECTED (Cut Off Level 10 ng/mL) Final   POC Oxazepam (BZO) 09/26/2023 None Detected  NONE DETECTED (Cut Off Level 300 ng/mL) Final   POC Cocaine UR 09/26/2023 None Detected  NONE DETECTED (Cut Off  Level 300 ng/mL) Final   POC Methamphetamine UR 09/26/2023 Positive (A)  NONE DETECTED (Cut Off Level 1000 ng/mL) Final   POC Morphine 09/26/2023 None Detected  NONE DETECTED (Cut Off Level 300 ng/mL) Final   POC Methadone UR 09/26/2023 None Detected  NONE DETECTED (Cut Off Level 300 ng/mL) Final   POC Oxycodone UR 09/26/2023 None Detected  NONE DETECTED (Cut Off Level 100 ng/mL) Final   POC Marijuana UR 09/26/2023 None Detected  NONE DETECTED (Cut Off Level 50 ng/mL) Final    Blood Alcohol level:  Lab Results  Component Value Date   ETH <10 09/26/2023    Metabolic Disorder Labs: No results found for: "HGBA1C", "MPG" No results found for: "PROLACTIN" No results found for: "CHOL", "TRIG", "HDL", "CHOLHDL", "VLDL", "LDLCALC"  Therapeutic Lab Levels: No results found for: "LITHIUM" No results found for: "VALPROATE" No results found for: "CBMZ"  Physical Findings   PHQ2-9    Flowsheet Row ED from 09/26/2023 in [facility]  PHQ-2 Total Score 3  PHQ-9 Total Score 15      Flowsheet Row ED from 09/26/2023 in [facility] Most recent reading at 09/26/2023 11:52 PM ED from 09/26/2023 in [facility] Most recent reading at 09/26/2023 10:16 PM  C-SSRS RISK CATEGORY No Risk No Risk        Musculoskeletal  Strength & Muscle Tone: within normal limits Gait & Station: normal Patient leans: N/A  Psychiatric Specialty Exam  Presentation  General Appearance:  Casual; Appropriate  for Environment  Eye Contact: Good  Speech: Clear and Coherent  Speech Volume: Normal  Handedness: Right   Mood and Affect  Mood: Depressed  Affect: Congruent   Thought Process  Thought Processes: Coherent  Descriptions of Associations:Intact  Orientation:Full (Time, Place and Person)  Thought Content:Logical  Diagnosis of Schizophrenia or Schizoaffective disorder in past: No    Hallucinations:Hallucinations: None  Ideas of Reference:None  Suicidal Thoughts:Suicidal Thoughts: No  Homicidal Thoughts:Homicidal Thoughts: No   Sensorium  Memory: Immediate Good  Judgment: Impaired  Insight: Fair   Chartered certified accountant: Fair  Attention Span: Fair  Recall: Fair  Fund of Knowledge: Fair  Language: Fair   Psychomotor Activity  Psychomotor Activity: Psychomotor Activity: Normal   Assets  Assets: Desire for Improvement; Resilience   Sleep  Sleep: Sleep: Fair Number of Hours of Sleep: 6   Nutritional Assessment (For OBS and FBC admissions only) Has the patient had a weight loss or gain of 10 pounds or more in the last 3 months?: No Has the patient had a decrease in food intake/or appetite?: No Does the patient have dental problems?: No Does the patient have eating habits or behaviors that may be indicators of an eating disorder including binging or inducing vomiting?: No Has the patient recently lost weight without trying?: 0 Has the patient been eating poorly because of a decreased appetite?: 0 Malnutrition Screening Tool Score: 0    Physical Exam  Physical Exam Vitals reviewed.  Constitutional:      Appearance: Normal appearance.  HENT:     Head: Normocephalic and atraumatic.  Cardiovascular:     Rate and Rhythm: Normal rate.  Pulmonary:     Effort: Pulmonary effort is normal.  Neurological:     Mental Status: She is alert and oriented to person, place, and time.    Review of Systems  Constitutional:  Negative.   Cardiovascular: Negative.   Gastrointestinal: Negative.   Musculoskeletal: Negative.  Neurological: Negative.    Blood pressure 137/88, pulse 91, temperature 97.8 F (36.6 C), temperature source Oral, resp. rate 18, SpO2 100%. There is no height or weight on file to calculate BMI.  Treatment Plan Summary:  Stimulant Use Disorder (meth type) Substance induced mood d/o -Encourage cessation -IVDU labs lending -PRN Tylenol, maalox/mylanta, milk of magnesia, bentyl, immodium, robaxin, naprosyn, zofran  Tobacco Use Disorder -Nicotine patch ordered -Encourage smoking cessation   Lance Muss, MD 09/27/2023 8:48 AM

## 2023-09-27 NOTE — Group Note (Deleted)
Group Topic: Change and Accountability  Group Date: 09/27/2023 Start Time: 0800 End Time: 0900 Facilitators: Guss Bunde  Department: [facility]  Number of Participants: 2  Group Focus: activities of daily living skills Treatment Modality:  Dialectical Behavioral Therapy Interventions utilized were exploration Purpose: express irrational fears, improve communication skills, and increase insight   Name: Melanie Ruiz Date of Birth: February 14, 1996  MR: [id number]    Level of Participation: {THERAPIES; PSYCH GROUP PARTICIPATION LOVFI:43329} Quality of Participation: {THERAPIES; PSYCH QUALITY OF PARTICIPATION:23992} Interactions with others: {THERAPIES; PSYCH INTERACTIONS:23993} Mood/Affect: {THERAPIES; PSYCH MOOD/AFFECT:23994} Triggers (if applicable): *** Cognition: {THERAPIES; PSYCH COGNITION:23995} Progress: {THERAPIES; PSYCH PROGRESS:23997} Response: *** Plan: {THERAPIES; PSYCH JJOA:41660}  Patients Problems:  Patient Active Problem List   Diagnosis Date Noted   Substance abuse (HCC) 09/26/2023   IUD (intrauterine device) in place 08/21/2020   Anxiety and depression 09/02/2014   Tobacco use disorder 10/17/2013   Iron deficiency    Palpitations

## 2023-09-27 NOTE — ED Notes (Signed)
Patient present to [facility] with wanting help with substance abuse. Patient A&Ox4. Patient denies SI/HI and AVH. Patient does endorse having depression and feeling hopeless nearly every day. Patient oriented to unit and scheduled medication given. Patient denies any physical complaints when asked. No acute distress noted. Support and encouragement provided. Routine safety checks conducted according to facility protocol. Encouraged patient to notify staff if thoughts of harm toward self or others arise. Patient verbalize understanding and agreement. Will continue to monitor for safety.

## 2023-09-27 NOTE — ED Notes (Signed)
Patient resting quietly in bed with eyes closed. Respirations equal and unlabored, skin warm and dry, NAD. No change in assessment or acuity. Q 15 minute safety checks remain in place.   

## 2023-09-27 NOTE — ED Notes (Signed)
Patient is awake lying in bed without distress.  Patient is calm and is able to make needs known to staff.  No evidence of withdrawal at this time.  Will monitor patient and meet needs as warranted.

## 2023-09-27 NOTE — ED Notes (Signed)
Patient awoke and was angry and defensive. She immediately got on the phone and called boyfriend and family to come get her.  She then demanded discharge.  RN notified the NP on duty and NP is now talking with patient.  Patient did sign 72 hour letter and it is in her chart.

## 2023-09-27 NOTE — ED Notes (Signed)
Report by [facility] lab that purple top had clot in it. This writer went to recollect but was unsuccessful with finding a vein too stick. Patient was stuck twice on initial lab draw. Patient given water to hydrate. Will have someone to attempt later. Encouragement and support provided and Q 15 min safety checks remain in place.

## 2023-09-27 NOTE — Discharge Instructions (Addendum)
[facility] [address] [place], [place] [address] Hours: Outpatient: Mon-Fri 8AM to 4PM [facility] Unit: 24/7 Phone: [phone] Fax: [phone]  Adult Services: Advanced Access Walk-In Assessments - All Disabilities Routine Outpatient Therapy Psychiatry/Med Management Substance Abuse Intensive Outpatient (SAIOP) Medication Assisted Treatment - MAT (Suboxone) Mobile Crisis Tailored Care Management (TCM)  [facility] [address] [place], [place] [address]  [phone] Outpatient adult programs include Partial Hospitalization (day) and Intensive Outpatient and are available for mental health and those with dual substance use disorder.

## 2023-09-27 NOTE — ED Notes (Signed)
Patient has remained asleep in bed refusing lunch.  She is easily arousal however falls back to sleep quickly.  Patient denies signs of withdrawal and none observed.  Will monitor and encourage PO fluids.

## 2023-09-27 NOTE — Group Note (Signed)
Group Topic: Recovery Basics  Group Date: 09/27/2023 Start Time: 1130 End Time: 1150 Facilitators: Jenean Lindau, RN  Department: [facility]  Number of Participants: 3  Group Focus: chemical dependency education and chemical dependency issues Treatment Modality:  Behavior Modification Therapy Interventions utilized were patient education and problem solving Purpose: express feelings, express irrational fears, improve communication skills, increase insight, and regain self-worth  Name: Melanie Ruiz Date of Birth: May 31, 1996  MR: [id number]    Level of Participation: minimal Quality of Participation: attentive Interactions with others: gave feedback Mood/Affect: appropriate Triggers (if applicable):   Cognition: coherent/clear Progress: Minimal Response:   Plan: follow-up needed  Patients Problems:  Patient Active Problem List   Diagnosis Date Noted   Substance abuse (HCC) 09/26/2023   IUD (intrauterine device) in place 08/21/2020   Anxiety and depression 09/02/2014   Tobacco use disorder 10/17/2013   Iron deficiency    Palpitations

## 2023-09-27 NOTE — Tx Team (Signed)
LCSW met with patient at the bedsude to assess current mood, affect, physical state, and inquire about needs/goals while here in [facility] and after discharge. Patient reports she presented due to needing to detox and seek further assistance for her substance use. Patient reports she lives alone with her 28 year old niece. Patient reports she also currently has a female friend that is staying in the home to help care for her niece as needed. Patient reports this female friend does not use substances and is supportive. 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 Patient denies any prior history of inpatient substance abuse treatment before, however reports she did receive outpatient treatment about 3 years ago. Patient reports she did not stay consistent with the treatment and has not received any since. Patient reports her current goal is to detox and then discharge with outpatient resources in place for continued support. Patient made aware of current recommendation for residential placement due to extensive use. However, patient reports she will not be able to complete a residential program as she does not want to leave her niece for that long. Patient reports she is interested in an intensive outpatient program and would like those services to be in [facility] where she resides. Patient denies having any legal charges or upcoming courts other than traffic violations. Patient reports she is not currently working, however reports she has an inheritance from her grandmother's estate. Patient reports she was brought to the [facility] by her boyfriend and reports he would pick her up once she is ready for discharge. Patient aware of process for [facility]. Patient declined collateral being gained prior to discharge. No other needs to report at this time. LCSW will search for IOP in [facility] that will be able to assist the patient further with her substance use.    Fernande Boyden,  LCSW Clinical Social Worker [facility] Ph: [phone]

## 2023-09-27 NOTE — ED Provider Notes (Signed)
Patient requested to leave the facility AMA. Patient stated that she only agreed to inpatient treatment because her boyfriend wanted her to come. Patient states that she has been using methamphetamine for six years but she does not feel inpatient treatment is for her. Patient stated that she had called a friend who is on the way to pick her up and she wants to leave asap. Patient reports that she wants to have substance abuse treatment in an outpatient setting. Discussed the risks of leaving treatment AMA such as worsening of symptoms or experiencing withdrawals or relapsing on substances. Patient is alert oriented x 4, slightly agitated, irritable mood with congruent affect, speech is coherent, and thought process is lucid. Patient denies any SI/HI or AVH or experiencing any paranoia, delusions or mania. Patient left the [facility] AMA.
